# Patient Record
Sex: Female | Born: 1990 | Race: White | Hispanic: No | Marital: Married | Smoking: Current every day smoker
Health system: Southern US, Community
[De-identification: ages and names within clinical notes are randomized; demographics above are authoritative.]

## PROBLEM LIST (undated history)

## (undated) ENCOUNTER — Inpatient Hospital Stay (HOSPITAL_COMMUNITY): Payer: Self-pay

## (undated) DIAGNOSIS — F329 Major depressive disorder, single episode, unspecified: Secondary | ICD-10-CM

## (undated) DIAGNOSIS — G8929 Other chronic pain: Secondary | ICD-10-CM

## (undated) DIAGNOSIS — Z8742 Personal history of other diseases of the female genital tract: Secondary | ICD-10-CM

## (undated) DIAGNOSIS — M549 Dorsalgia, unspecified: Secondary | ICD-10-CM

## (undated) DIAGNOSIS — R519 Headache, unspecified: Secondary | ICD-10-CM

## (undated) DIAGNOSIS — R4586 Emotional lability: Secondary | ICD-10-CM

## (undated) DIAGNOSIS — F32A Depression, unspecified: Secondary | ICD-10-CM

## (undated) DIAGNOSIS — F419 Anxiety disorder, unspecified: Secondary | ICD-10-CM

## (undated) DIAGNOSIS — R51 Headache: Secondary | ICD-10-CM

## (undated) DIAGNOSIS — Q627 Congenital vesico-uretero-renal reflux: Secondary | ICD-10-CM

## (undated) HISTORY — PX: CYSTOSCOPY W/ URETERAL STENT PLACEMENT: SHX1429

## (undated) HISTORY — DX: Personal history of other diseases of the female genital tract: Z87.42

## (undated) HISTORY — DX: Anxiety disorder, unspecified: F41.9

---

## 2015-11-11 NOTE — L&D Delivery Note (Signed)
Delivery Note At 11:01 PM a viable and healthy female was delivered via Vaginal, Spontaneous Delivery (Presentation: LOA ).  APGAR: 8, 9; weight  pending.   Placenta status: spontaneous, intact.  Cord:  with the following complications: none.  Cord pH: na  Anesthesia:  epidural Episiotomy: Median due to persistent fetal bradycardia Lacerations: 2nd degree Suture Repair: 2.0 vicryl rapide Est. Blood Loss (mL): 250  Mom to postpartum.  Baby to Couplet care / Skin to Skin.  Davi Kroon J 10/23/2016, 11:24 PM

## 2016-02-12 DIAGNOSIS — Z3A01 Less than 8 weeks gestation of pregnancy: Secondary | ICD-10-CM | POA: Diagnosis not present

## 2016-02-12 DIAGNOSIS — O0901 Supervision of pregnancy with history of infertility, first trimester: Secondary | ICD-10-CM | POA: Diagnosis not present

## 2016-02-12 DIAGNOSIS — Z3201 Encounter for pregnancy test, result positive: Secondary | ICD-10-CM | POA: Diagnosis not present

## 2016-02-14 DIAGNOSIS — Z3A01 Less than 8 weeks gestation of pregnancy: Secondary | ICD-10-CM | POA: Diagnosis not present

## 2016-02-14 DIAGNOSIS — O0901 Supervision of pregnancy with history of infertility, first trimester: Secondary | ICD-10-CM | POA: Diagnosis not present

## 2016-02-18 DIAGNOSIS — Z3A01 Less than 8 weeks gestation of pregnancy: Secondary | ICD-10-CM | POA: Diagnosis not present

## 2016-02-18 DIAGNOSIS — O0901 Supervision of pregnancy with history of infertility, first trimester: Secondary | ICD-10-CM | POA: Diagnosis not present

## 2016-02-27 DIAGNOSIS — R300 Dysuria: Secondary | ICD-10-CM | POA: Diagnosis not present

## 2016-02-27 DIAGNOSIS — Z3201 Encounter for pregnancy test, result positive: Secondary | ICD-10-CM | POA: Diagnosis not present

## 2016-03-28 DIAGNOSIS — Z3401 Encounter for supervision of normal first pregnancy, first trimester: Secondary | ICD-10-CM | POA: Diagnosis not present

## 2016-03-28 LAB — OB RESULTS CONSOLE ABO/RH: RH Type: POSITIVE

## 2016-03-28 LAB — OB RESULTS CONSOLE RUBELLA ANTIBODY, IGM: RUBELLA: IMMUNE

## 2016-03-28 LAB — OB RESULTS CONSOLE GC/CHLAMYDIA
Chlamydia: NEGATIVE
GC PROBE AMP, GENITAL: NEGATIVE

## 2016-03-28 LAB — OB RESULTS CONSOLE ANTIBODY SCREEN: ANTIBODY SCREEN: NEGATIVE

## 2016-03-28 LAB — OB RESULTS CONSOLE HEPATITIS B SURFACE ANTIGEN: HEP B S AG: NEGATIVE

## 2016-03-28 LAB — OB RESULTS CONSOLE HIV ANTIBODY (ROUTINE TESTING): HIV: NONREACTIVE

## 2016-03-28 LAB — OB RESULTS CONSOLE RPR: RPR: NONREACTIVE

## 2016-04-03 DIAGNOSIS — Z113 Encounter for screening for infections with a predominantly sexual mode of transmission: Secondary | ICD-10-CM | POA: Diagnosis not present

## 2016-04-03 DIAGNOSIS — Z3491 Encounter for supervision of normal pregnancy, unspecified, first trimester: Secondary | ICD-10-CM | POA: Diagnosis not present

## 2016-04-03 DIAGNOSIS — O0901 Supervision of pregnancy with history of infertility, first trimester: Secondary | ICD-10-CM | POA: Diagnosis not present

## 2016-04-03 DIAGNOSIS — Z36 Encounter for antenatal screening of mother: Secondary | ICD-10-CM | POA: Diagnosis not present

## 2016-04-10 DIAGNOSIS — Z36 Encounter for antenatal screening of mother: Secondary | ICD-10-CM | POA: Diagnosis not present

## 2016-05-08 DIAGNOSIS — Z36 Encounter for antenatal screening of mother: Secondary | ICD-10-CM | POA: Diagnosis not present

## 2016-05-08 DIAGNOSIS — Z3402 Encounter for supervision of normal first pregnancy, second trimester: Secondary | ICD-10-CM | POA: Diagnosis not present

## 2016-05-19 ENCOUNTER — Ambulatory Visit: Payer: Self-pay | Admitting: Family Medicine

## 2016-05-26 DIAGNOSIS — Z3482 Encounter for supervision of other normal pregnancy, second trimester: Secondary | ICD-10-CM | POA: Diagnosis not present

## 2016-05-26 DIAGNOSIS — Z36 Encounter for antenatal screening of mother: Secondary | ICD-10-CM | POA: Diagnosis not present

## 2016-06-10 DIAGNOSIS — Z36 Encounter for antenatal screening of mother: Secondary | ICD-10-CM | POA: Diagnosis not present

## 2016-07-25 DIAGNOSIS — Z36 Encounter for antenatal screening of mother: Secondary | ICD-10-CM | POA: Diagnosis not present

## 2016-07-25 DIAGNOSIS — Z23 Encounter for immunization: Secondary | ICD-10-CM | POA: Diagnosis not present

## 2016-07-25 DIAGNOSIS — Z3482 Encounter for supervision of other normal pregnancy, second trimester: Secondary | ICD-10-CM | POA: Diagnosis not present

## 2016-08-11 DIAGNOSIS — Z23 Encounter for immunization: Secondary | ICD-10-CM | POA: Diagnosis not present

## 2016-09-25 DIAGNOSIS — Z3483 Encounter for supervision of other normal pregnancy, third trimester: Secondary | ICD-10-CM | POA: Diagnosis not present

## 2016-09-25 DIAGNOSIS — Z3685 Encounter for antenatal screening for Streptococcus B: Secondary | ICD-10-CM | POA: Diagnosis not present

## 2016-09-25 LAB — OB RESULTS CONSOLE GBS: GBS: POSITIVE

## 2016-10-06 ENCOUNTER — Inpatient Hospital Stay (HOSPITAL_COMMUNITY)
Admission: AD | Admit: 2016-10-06 | Discharge: 2016-10-06 | Disposition: A | Discharge status: Home / Self Care | Payer: BLUE CROSS/BLUE SHIELD | Source: Ambulatory Visit | Attending: Emergency Medicine | Admitting: Emergency Medicine

## 2016-10-06 ENCOUNTER — Encounter (HOSPITAL_COMMUNITY): Payer: Self-pay | Admitting: *Deleted

## 2016-10-06 ENCOUNTER — Inpatient Hospital Stay (HOSPITAL_COMMUNITY): Payer: BLUE CROSS/BLUE SHIELD

## 2016-10-06 DIAGNOSIS — R4781 Slurred speech: Secondary | ICD-10-CM | POA: Diagnosis not present

## 2016-10-06 DIAGNOSIS — O9989 Other specified diseases and conditions complicating pregnancy, childbirth and the puerperium: Secondary | ICD-10-CM | POA: Insufficient documentation

## 2016-10-06 DIAGNOSIS — R479 Unspecified speech disturbances: Secondary | ICD-10-CM | POA: Diagnosis not present

## 2016-10-06 DIAGNOSIS — O212 Late vomiting of pregnancy: Secondary | ICD-10-CM | POA: Diagnosis not present

## 2016-10-06 DIAGNOSIS — Z3A38 38 weeks gestation of pregnancy: Secondary | ICD-10-CM | POA: Insufficient documentation

## 2016-10-06 DIAGNOSIS — Z79899 Other long term (current) drug therapy: Secondary | ICD-10-CM | POA: Diagnosis not present

## 2016-10-06 DIAGNOSIS — E876 Hypokalemia: Secondary | ICD-10-CM | POA: Insufficient documentation

## 2016-10-06 DIAGNOSIS — Z7982 Long term (current) use of aspirin: Secondary | ICD-10-CM | POA: Insufficient documentation

## 2016-10-06 DIAGNOSIS — F17200 Nicotine dependence, unspecified, uncomplicated: Secondary | ICD-10-CM | POA: Diagnosis not present

## 2016-10-06 DIAGNOSIS — O99283 Endocrine, nutritional and metabolic diseases complicating pregnancy, third trimester: Secondary | ICD-10-CM | POA: Diagnosis not present

## 2016-10-06 DIAGNOSIS — R220 Localized swelling, mass and lump, head: Secondary | ICD-10-CM | POA: Diagnosis not present

## 2016-10-06 DIAGNOSIS — R4701 Aphasia: Secondary | ICD-10-CM | POA: Diagnosis not present

## 2016-10-06 HISTORY — DX: Emotional lability: R45.86

## 2016-10-06 HISTORY — DX: Dorsalgia, unspecified: M54.9

## 2016-10-06 HISTORY — DX: Depression, unspecified: F32.A

## 2016-10-06 HISTORY — DX: Headache: R51

## 2016-10-06 HISTORY — DX: Major depressive disorder, single episode, unspecified: F32.9

## 2016-10-06 HISTORY — DX: Headache, unspecified: R51.9

## 2016-10-06 HISTORY — DX: Congenital vesico-uretero-renal reflux: Q62.7

## 2016-10-06 HISTORY — DX: Other chronic pain: G89.29

## 2016-10-06 LAB — CBC WITH DIFFERENTIAL/PLATELET
BASOS ABS: 0 10*3/uL (ref 0.0–0.1)
Basophils Relative: 0 %
EOS PCT: 0 %
Eosinophils Absolute: 0.1 10*3/uL (ref 0.0–0.7)
HCT: 31.7 % — ABNORMAL LOW (ref 36.0–46.0)
Hemoglobin: 11.2 g/dL — ABNORMAL LOW (ref 12.0–15.0)
LYMPHS PCT: 16 %
Lymphs Abs: 2.1 10*3/uL (ref 0.7–4.0)
MCH: 31.5 pg (ref 26.0–34.0)
MCHC: 35.3 g/dL (ref 30.0–36.0)
MCV: 89 fL (ref 78.0–100.0)
MONO ABS: 0.6 10*3/uL (ref 0.1–1.0)
MONOS PCT: 4 %
Neutro Abs: 10.2 10*3/uL — ABNORMAL HIGH (ref 1.7–7.7)
Neutrophils Relative %: 80 %
PLATELETS: 295 10*3/uL (ref 150–400)
RBC: 3.56 MIL/uL — ABNORMAL LOW (ref 3.87–5.11)
RDW: 12.8 % (ref 11.5–15.5)
WBC: 12.9 10*3/uL — ABNORMAL HIGH (ref 4.0–10.5)

## 2016-10-06 LAB — COMPREHENSIVE METABOLIC PANEL
ALBUMIN: 2.8 g/dL — AB (ref 3.5–5.0)
ALK PHOS: 98 U/L (ref 38–126)
ALT: 11 U/L — ABNORMAL LOW (ref 14–54)
AST: 18 U/L (ref 15–41)
Anion gap: 9 (ref 5–15)
BILIRUBIN TOTAL: 0.5 mg/dL (ref 0.3–1.2)
BUN: 5 mg/dL — AB (ref 6–20)
CALCIUM: 8.6 mg/dL — AB (ref 8.9–10.3)
CO2: 22 mmol/L (ref 22–32)
Chloride: 103 mmol/L (ref 101–111)
Creatinine, Ser: 0.6 mg/dL (ref 0.44–1.00)
GFR calc Af Amer: >60 mL/min (ref >60)
GFR calc non Af Amer: >60 mL/min (ref >60)
GLUCOSE: 80 mg/dL (ref 65–99)
POTASSIUM: 3.2 mmol/L — AB (ref 3.5–5.1)
Sodium: 134 mmol/L — ABNORMAL LOW (ref 135–145)
TOTAL PROTEIN: 5.5 g/dL — AB (ref 6.5–8.1)

## 2016-10-06 LAB — URINALYSIS, ROUTINE W REFLEX MICROSCOPIC
BILIRUBIN URINE: NEGATIVE
GLUCOSE, UA: NEGATIVE mg/dL
KETONES UR: NEGATIVE mg/dL
Leukocytes, UA: NEGATIVE
Nitrite: NEGATIVE
PH: 7.5 (ref 5.0–8.0)
Protein, ur: NEGATIVE mg/dL
Specific Gravity, Urine: 1.01 (ref 1.005–1.030)

## 2016-10-06 LAB — URINE MICROSCOPIC-ADD ON: RBC / HPF: NONE SEEN RBC/hpf (ref 0–5)

## 2016-10-06 MED ORDER — POTASSIUM CHLORIDE CRYS ER 20 MEQ PO TBCR
40.0000 meq | EXTENDED_RELEASE_TABLET | Freq: Once | ORAL | Status: AC
  Administered 2016-10-06: 40 meq via ORAL
  Filled 2016-10-06: qty 2

## 2016-10-06 MED ORDER — ACETAMINOPHEN 325 MG PO TABS
650.0000 mg | ORAL_TABLET | Freq: Once | ORAL | Status: AC
  Administered 2016-10-06: 650 mg via ORAL
  Filled 2016-10-06: qty 2

## 2016-10-06 MED ORDER — POTASSIUM CHLORIDE ER 20 MEQ PO TBCR: 20.0000 meq | EXTENDED_RELEASE_TABLET | Freq: Two times a day (BID) | ORAL | 0 refills | Status: DC

## 2016-10-06 NOTE — Discharge Instructions (Signed)
Your MRI and bloodwork were unremarkable. Your potassium level was slightly low so I have given you a prescription for a few days of supplements. Follow up with your OB this week. Return to the emergency room for new or worsening symptoms.

## 2016-10-06 NOTE — MAU Provider Note (Signed)
History    CSN: [id number]  Arrival date and time: 10/06/16 1435   First Provider Initiated Contact with Patient 10/06/16 1545     CC: 25 yo female, G1, [redacted] wks pregnant, presents to MAU with complaints of "stroke like symptoms" slurred speech, facial drooping, heavy tongue and dribbling water from moth angle today. She is more fatigued, emotional, and vomiting since yesterday.  Her husband noted her face looked very droopy and her friend told her she sounded very slurred on the phone. She feels well now and appears normal per her husband and her mother. She denies migraine before this episode but feels very exhausted, tired now. Denies loss of consciousness, loss of bladder/ bowel control. Denies HA now.   Pt's mother reports of similar symptoms at 7616-17 yrs age, when she had severe migraine, was in doctor's office, was brought into a dark room, when she started to have arm and leg numbness, followed by torso numbness, then facial drooping and numbness. She was brought to the hospital and CT was negative. Her symptoms resolved within one hour entirely. The difference today per pt and her mother is that she didn't have migraine today.  She had an other CT of head in 18-19 at [facility] for dizziness and was clear. Heart was clear. She does not recall getting EEG. She does not have diagnosis of epilepsy.   No h/o VTE/thrombophilia in family. Pt has tolerated OCs in past, but stopped in few weeks due to abnormal bleeding. She does not smoke.   Reports good fetal movements, denies contractions, bleeding, leakage of fluid per vagina. Good PNCare, primary is Dr Billy Coastaavon and also gets migraines meds from him, does not have a Insurance account managereurologist.   HPI  Past Medical History:  Diagnosis Date  . Chronic back pain    States she thinks it is scoliosis, not diagnosed  . Depression   . Headache   . Mood swings (HCC)   . Unilateral congenital vesico-uretero-renal reflux    L kidney, has received tx, "putty" to  prevent reflux, [facility]    Past Surgical History:  Procedure Laterality Date  . CYSTOSCOPY W/ URETERAL STENT PLACEMENT      History reviewed. No pertinent family history.  Social History  Substance Use Topics  . Smoking status: Current Every Day Smoker    Packs/day: 1.00  . Smokeless tobacco: Never Used  . Alcohol use Not on file    Allergies:  Allergies  Allergen Reactions  . Sulfur Hives and Itching    Prescriptions Prior to Admission  Medication Sig Dispense Refill Last Dose  . aspirin-acetaminophen-caffeine (EXCEDRIN MIGRAINE) 250-250-65 MG tablet Take 1 tablet by mouth every 6 (six) hours as needed for headache.   10/06/2016 at Unknown time  . butalbital-acetaminophen-caffeine (FIORICET, ESGIC) 50-325-40 MG tablet Take 1-2 tablets by mouth every 4 (four) hours as needed. Max 6 tabs per day  0 Past Week at Unknown time  . FLUoxetine (PROZAC) 10 MG capsule Take 10 mg by mouth daily. Take with 20mg  cap for total dose of 30mg  QD  7 10/06/2016 at Unknown time  . FLUoxetine (PROZAC) 20 MG capsule Take 10 mg by mouth daily. Take with 10mg  cap for total dose of 30mg  QD  11 10/06/2016 at Unknown time  . Prenatal Vit-Fe Fumarate-FA (PRENATAL MULTIVITAMIN) TABS tablet Take 1 tablet by mouth daily at 12 noon.   10/06/2016 at Unknown time    ROS Physical Exam   Blood pressure 131/80, pulse  89, temperature 98.2 F (36.8 C), resp. rate 18, height 5\' 6"  (1.676 m), weight 169 lb (76.7 kg), last menstrual period 01/12/2016.  Physical Exam A&O x 3, no acute distress. Pleasant.Speech normal per family.  HEENT neg. Cranial nerve exam grossly normal  Abdo soft, non tender, non acute gravid uterus  Extr no edema/ tenderness. Normal motor/sensory exam  FHT 125-130/ + accels/ no decels/ mod variability- NST reactive/  No contractions  MAU Course  Procedures None   Assessment and Plan  25 yo female at 7238 wks gestation with acute transient neurologic changes with prior  such history at 4816 but this time not associated with migraine per patient.  Spoke with [facility] ER physician. Recommending [facility] for complete Neuro evaluation as patient and family desire to proceed soon.  Patient informed, will work on transport. [facility] Change Nurse to communicate with ED charge nurse.  Pt keeps her f/up with Dr Billy Coastaavon as scheduled on 10/10/16  Sumaiyah Markert R 10/06/2016, 4:41 PM

## 2016-10-06 NOTE — ED Triage Notes (Signed)
Patient comes in with c/o nausea, vomiting and tongue swelling since yesterday. Patient with first pregancy and 38 weeks. Patient's been nauseous throughout pregnancy but it has worsened. Her nausea meds prescribed by ob/gyn made her more nauseous. Patient went to women's and they ruled out maternity issues. Patient is a/ox4. No unilateral weakness. No dizziness. No new meds to cause swelling of tongue. Tonsils and uvula look normal.

## 2016-10-06 NOTE — MAU Provider Note (Signed)
History     CSN: [id number]  Arrival date and time: 10/06/16 1435   First Provider Initiated Contact with Patient 10/06/16 1545      Chief Complaint  Patient presents with  . Emesis   HPI  Ms.Becky Stafford is a 25 y.o. female G1P0 @ 6771w2d here in MAU with "stroke like symptoms" concerns about her mouth feeling swollen and vomiting. In the last 24 hours she has vomited 3-4 times.   She has a history of migraine, her last migraine was this morning and it went away after taking excedrin. She had symptoms like this around age 25. She was never followed up by neurology.   Yesterday she felt like she was spitting her food and spitting her salvia when she tried to speak. The symptoms are coming and going. Currently she feels like the symptoms are resolving. Patient's husband felt like she was slurring her words, and sounded "slow." on the phone.    Denies new medication use, no one else in the house is sick.   OB History    Gravida Para Term Preterm AB Living   1             SAB TAB Ectopic Multiple Live Births                  Past Medical History:  Diagnosis Date  . Chronic back pain    States she thinks it is scoliosis, not diagnosed  . Depression   . Headache   . Mood swings (HCC)   . Unilateral congenital vesico-uretero-renal reflux    L kidney, has received tx, "putty" to prevent reflux, [facility]    Past Surgical History:  Procedure Laterality Date  . CYSTOSCOPY W/ URETERAL STENT PLACEMENT      History reviewed. No pertinent family history.  Social History  Substance Use Topics  . Smoking status: Current Every Day Smoker    Packs/day: 1.00  . Smokeless tobacco: Never Used  . Alcohol use Not on file    Allergies: Allergies not on file  No prescriptions prior to admission.   Results for orders placed or performed during the hospital encounter of 10/06/16 (from the past 48 hour(s))  Urinalysis, Routine w reflex microscopic (not at [facility])      Status: Abnormal   Collection Time: 10/06/16  3:30 PM  Result Value Ref Range   Color, Urine YELLOW YELLOW   APPearance CLEAR CLEAR   Specific Gravity, Urine 1.010 1.005 - 1.030   pH 7.5 5.0 - 8.0   Glucose, UA NEGATIVE NEGATIVE mg/dL   Hgb urine dipstick TRACE (A) NEGATIVE   Bilirubin Urine NEGATIVE NEGATIVE   Ketones, ur NEGATIVE NEGATIVE mg/dL   Protein, ur NEGATIVE NEGATIVE mg/dL   Nitrite NEGATIVE NEGATIVE   Leukocytes, UA NEGATIVE NEGATIVE  Urine microscopic-add on     Status: Abnormal   Collection Time: 10/06/16  3:30 PM  Result Value Ref Range   Squamous Epithelial / LPF 6-30 (A) NONE SEEN   WBC, UA 0-5 0 - 5 WBC/hpf   RBC / HPF NONE SEEN 0 - 5 RBC/hpf   Bacteria, UA FEW (A) NONE SEEN    Review of Systems  Constitutional: Negative for chills and fever.  Gastrointestinal: Positive for nausea and vomiting. Negative for diarrhea.   Physical Exam   Blood pressure 131/80, pulse 89, temperature 98.2 F (36.8 C), resp. rate 18, height 5\' 6"  (1.676 m), weight 169 lb (76.7 kg), last menstrual period 01/12/2016.  Physical Exam  Constitutional: She is oriented to person, place, and time. She appears well-developed and well-nourished. No distress.  HENT:  Head: Normocephalic.  Eyes: Pupils are equal, round, and reactive to light.  Neck: Neck supple.  Musculoskeletal: Normal range of motion.  Neurological: She is alert and oriented to person, place, and time. She has normal strength and normal reflexes. She displays no tremor and normal reflexes. No cranial nerve deficit or sensory deficit. She exhibits normal muscle tone. She displays no seizure activity. Coordination and gait normal. GCS eye subscore is 4. GCS verbal subscore is 5. GCS motor subscore is 6.  Skin: Skin is warm. She is not diaphoretic.  Psychiatric: Her behavior is normal.    MAU Course  Procedures  None   MDM  Discussed patient with Dr. Juliene PinaMody @ 1400; Dr. Juliene PinaMody to come to MAU to evaluate patient with plan  for possible transfer.   Assessment and Plan    Transfer to [facility] per Dr. Juliene PinaMody. Category one fetal tracing    Duane LopeJennifer I Rasch, NP 10/06/2016 4:52 PM

## 2016-10-06 NOTE — MAU Note (Signed)
Pt presents to MAU with complaints of "stroke like symptoms" slurred speech, fatigue, emotional, and vomiting since yesterday. Denies any vaginal bleeding abnormal discharge

## 2016-10-06 NOTE — ED Provider Notes (Signed)
[facility]-EMERGENCY DEPT Provider Note   CSN: [id number] Arrival date & time: 10/06/16  1435   History   Chief Complaint Chief Complaint  Patient presents with  . Emesis   HPI   Becky Stafford is an 25 y.o. female G1 currently 5038 weeks pregnant who presents to the emergency room from MAU for evaluation of intermittent transient slurred speech. Over the past 24 hrs pt has had intermittent episodes of slurred speech, facial drooping. Her husband accompanies her and states that when he arrived home this afternoon pt's speech sounded slurred like she couldn't pronounce her words. She denies arm or leg numbness or weakness. She states she had a headache this morning but took excedrin and her headache resolved. She states that a similar episode of slurred speech occurred once before when she was a teenager and occurred with a migraine. She states today's symptoms feel identical but denies migraine today. Per EMS pt's symptoms resolved while they were en route and pt's speech returned to baseline, though are now returning in the ED. She denies headache. Denies blurred vision. Denies chest pain, SOB, abdominal pain, n/v/d, vaginal bleeding or discharge. Fetal movements are good. Fetal tracing at MAU unremarkable. Pt denies history of clots or malignancy, denies family history of same. She takes Fioricet as needed for headaches at home, last taken one week ago. She is on prozac, no new changes to meds. Endorses smoking 1 PPD, denies other drug or alcohol use.  Past Medical History:  Diagnosis Date  . Chronic back pain    States she thinks it is scoliosis, not diagnosed  . Depression   . Headache   . Mood swings (HCC)   . Unilateral congenital vesico-uretero-renal reflux    L kidney, has received tx, "putty" to prevent reflux, [facility]    There are no active problems to display for this patient.   Past Surgical History:  Procedure Laterality Date  . CYSTOSCOPY W/ URETERAL STENT  PLACEMENT      OB History    Gravida Para Term Preterm AB Living   1             SAB TAB Ectopic Multiple Live Births                   Home Medications    Prior to Admission medications   Medication Sig Start Date End Date Taking? Authorizing Provider  aspirin-acetaminophen-caffeine (EXCEDRIN MIGRAINE) [id number] MG tablet Take 1 tablet by mouth every 6 (six) hours as needed for headache.   Yes Historical Provider, MD  butalbital-acetaminophen-caffeine (FIORICET, ESGIC) 50-325-40 MG tablet Take 1-2 tablets by mouth every 4 (four) hours as needed. Max 6 tabs per day 07/30/16  Yes Historical Provider, MD  FLUoxetine (PROZAC) 10 MG capsule Take 10 mg by mouth daily. Take with 20mg  cap for total dose of 30mg  QD 09/09/16  Yes Historical Provider, MD  FLUoxetine (PROZAC) 20 MG capsule Take 10 mg by mouth daily. Take with 10mg  cap for total dose of 30mg  QD 09/09/16  Yes Historical Provider, MD  Prenatal Vit-Fe Fumarate-FA (PRENATAL MULTIVITAMIN) TABS tablet Take 1 tablet by mouth daily at 12 noon.   Yes Historical Provider, MD    Family History History reviewed. No pertinent family history.  Social History Social History  Substance Use Topics  . Smoking status: Current Every Day Smoker    Packs/day: 1.00  . Smokeless tobacco: Never Used  . Alcohol use Not on file     Allergies  Sulfur   Review of Systems Review of Systems 10 Systems reviewed and are negative for acute change except as noted in the HPI.   Physical Exam Updated Vital Signs BP 136/74 (BP Location: Right Arm)   Pulse 78   Temp 97.5 F (36.4 C) (Oral)   Resp 16   Ht 5\' 6"  (1.676 m)   Wt 76.7 kg   LMP 01/12/2016   BMI 27.28 kg/m   Physical Exam  Constitutional: She is oriented to person, place, and time.  HENT:  Right Ear: External ear normal.  Left Ear: External ear normal.  Nose: Nose normal.  Mouth/Throat: Oropharynx is clear and moist. No oropharyngeal exudate.  Eyes: Conjunctivae and EOM are  normal. Pupils are equal, round, and reactive to light.  Neck: Normal range of motion. Neck supple.  Cardiovascular: Normal rate, regular rhythm, normal heart sounds and intact distal pulses.   Pulmonary/Chest: Effort normal and breath sounds normal. No respiratory distress. She has no wheezes.  Abdominal: Soft. Bowel sounds are normal. She exhibits no distension. There is no tenderness. There is no rebound and no guarding.  gravid  Musculoskeletal: She exhibits no edema.  Lymphadenopathy:    She has no cervical adenopathy.  Neurological: She is alert and oriented to person, place, and time.  Speech slightly slurred with poor enunciation of some words. Slight droop of left corner of mouth. No tongue deviation. Cranial nerves otherwise intact 5/5 strength throughout Sensation intact throughout Normal finger to nose No pronator drift Steady gait 2+ DTR throughout  Skin: Skin is warm and dry.  Psychiatric: She has a normal mood and affect.  Nursing note and vitals reviewed.    ED Treatments / Results  Labs (all labs ordered are listed, but only abnormal results are displayed) Labs Reviewed  URINALYSIS, ROUTINE W REFLEX MICROSCOPIC (NOT AT [facility]) - Abnormal; Notable for the following:       Result Value   Hgb urine dipstick TRACE (*)    All other components within normal limits  URINE MICROSCOPIC-ADD ON - Abnormal; Notable for the following:    Squamous Epithelial / LPF 6-30 (*)    Bacteria, UA FEW (*)    All other components within normal limits  COMPREHENSIVE METABOLIC PANEL - Abnormal; Notable for the following:    Sodium 134 (*)    Potassium 3.2 (*)    BUN 5 (*)    Calcium 8.6 (*)    Total Protein 5.5 (*)    Albumin 2.8 (*)    ALT 11 (*)    All other components within normal limits  CBC WITH DIFFERENTIAL/PLATELET - Abnormal; Notable for the following:    WBC 12.9 (*)    RBC 3.56 (*)    Hemoglobin 11.2 (*)    HCT 31.7 (*)    Neutro Abs 10.2 (*)    All other components  within normal limits    EKG  EKG Interpretation None       Radiology Mr Brain Wo Contrast  Result Date: 10/06/2016 CLINICAL DATA:  Recurrent episodes of mouth swelling and vomiting for 1 day. Slowed, slurred speech. Similar symptoms at age 25. EXAM: MRI HEAD WITHOUT CONTRAST TECHNIQUE: Multiplanar, multiecho pulse sequences of the brain and surrounding structures were obtained without intravenous contrast. COMPARISON:  None. FINDINGS: BRAIN: No reduced diffusion to suggest acute ischemia nor hyperacute demyelination. No susceptibility artifact to suggest hemorrhage. The ventricles and sulci are normal for patient's age. No suspicious parenchymal signal, masses or mass effect. No abnormal extra-axial  fluid collections. No extra-axial masses though, contrast enhanced sequences would be more sensitive. VASCULAR: Normal major intracranial vascular flow voids present at skull base. Major dural venous sinus flow voids maintained. SKULL AND UPPER CERVICAL SPINE: No abnormal sellar expansion. No suspicious calvarial bone marrow signal. Craniocervical junction maintained. SINUSES/ORBITS: Soft tissue opacifies the RIGHT sphenoid sinus. Trace mastoid effusions. The included ocular globes and orbital contents are non-suspicious. OTHER: None. IMPRESSION: Normal noncontrast MRI head. RIGHT sphenoid sinusitis. Electronically Signed   By: Awilda Metro M.D.   On: 10/06/2016 20:16    Procedures Procedures (including critical care time)  Medications Ordered in ED Medications - No data to display   Initial Impression / Assessment and Plan / ED Course  I have reviewed the triage vital signs and the nursing notes.  Pertinent labs & imaging results that were available during my care of the patient were reviewed by me and considered in my medical decision making (see chart for details).  Clinical Course    MRI negative. Her symptoms are improving and speech much clearer. Labs with mild hypokalemia and  otherwise unrevealing. Discussed with attending dr. Jeraldine Loots. We will replete pt's potassium. Encouraged close f/u with OB. Pt states she will f/u with neuro when things settle down. ER return precautions given.  Final Clinical Impressions(s) / ED Diagnoses   Final diagnoses:  Speech disturbance, unspecified type  Hypokalemia    New Prescriptions Discharge Medication List as of 10/06/2016  9:40 PM    START taking these medications   Details  potassium chloride 20 MEQ TBCR Take 20 mEq by mouth 2 (two) times daily., Starting Mon 10/06/2016, Print         Carlene Coria, PA-C 10/07/16 [id number]    Gerhard Munch, MD 10/08/16 [phone]

## 2016-10-06 NOTE — MAU Note (Signed)
[facility] in [facility] condition.

## 2016-10-06 NOTE — ED Notes (Signed)
Patient transported to MRI 

## 2016-10-07 DIAGNOSIS — O26893 Other specified pregnancy related conditions, third trimester: Secondary | ICD-10-CM | POA: Diagnosis not present

## 2016-10-07 DIAGNOSIS — R4781 Slurred speech: Secondary | ICD-10-CM | POA: Diagnosis not present

## 2016-10-07 DIAGNOSIS — R29810 Facial weakness: Secondary | ICD-10-CM | POA: Diagnosis not present

## 2016-10-08 DIAGNOSIS — Z3A38 38 weeks gestation of pregnancy: Secondary | ICD-10-CM | POA: Diagnosis not present

## 2016-10-08 DIAGNOSIS — O368130 Decreased fetal movements, third trimester, not applicable or unspecified: Secondary | ICD-10-CM | POA: Diagnosis not present

## 2016-10-10 DIAGNOSIS — Z3A38 38 weeks gestation of pregnancy: Secondary | ICD-10-CM | POA: Diagnosis not present

## 2016-10-10 DIAGNOSIS — O368130 Decreased fetal movements, third trimester, not applicable or unspecified: Secondary | ICD-10-CM | POA: Diagnosis not present

## 2016-10-16 ENCOUNTER — Other Ambulatory Visit: Payer: Self-pay | Admitting: Obstetrics and Gynecology

## 2016-10-20 ENCOUNTER — Encounter (HOSPITAL_COMMUNITY): Payer: Self-pay | Admitting: *Deleted

## 2016-10-20 ENCOUNTER — Telehealth (HOSPITAL_COMMUNITY): Payer: Self-pay | Admitting: *Deleted

## 2016-10-20 NOTE — Telephone Encounter (Signed)
Preadmission screen  

## 2016-10-23 ENCOUNTER — Encounter (HOSPITAL_COMMUNITY): Payer: Self-pay

## 2016-10-23 ENCOUNTER — Inpatient Hospital Stay (HOSPITAL_COMMUNITY)
Admission: RE | Admit: 2016-10-23 | Discharge: 2016-10-25 | DRG: 775 | Disposition: A | Discharge status: Home / Self Care | Payer: BLUE CROSS/BLUE SHIELD | Source: Ambulatory Visit | Attending: Obstetrics and Gynecology | Admitting: Obstetrics and Gynecology

## 2016-10-23 ENCOUNTER — Inpatient Hospital Stay (HOSPITAL_COMMUNITY): Payer: BLUE CROSS/BLUE SHIELD | Admitting: Anesthesiology

## 2016-10-23 DIAGNOSIS — R94120 Abnormal auditory function study: Secondary | ICD-10-CM | POA: Diagnosis not present

## 2016-10-23 DIAGNOSIS — Z833 Family history of diabetes mellitus: Secondary | ICD-10-CM | POA: Diagnosis not present

## 2016-10-23 DIAGNOSIS — Z8759 Personal history of other complications of pregnancy, childbirth and the puerperium: Secondary | ICD-10-CM

## 2016-10-23 DIAGNOSIS — Z3A40 40 weeks gestation of pregnancy: Secondary | ICD-10-CM | POA: Diagnosis not present

## 2016-10-23 DIAGNOSIS — O480 Post-term pregnancy: Principal | ICD-10-CM | POA: Diagnosis present

## 2016-10-23 DIAGNOSIS — Z23 Encounter for immunization: Secondary | ICD-10-CM | POA: Diagnosis not present

## 2016-10-23 LAB — CBC
HCT: 32.6 % — ABNORMAL LOW (ref 36.0–46.0)
Hemoglobin: 11.9 g/dL — ABNORMAL LOW (ref 12.0–15.0)
MCH: 32.1 pg (ref 26.0–34.0)
MCHC: 36.5 g/dL — ABNORMAL HIGH (ref 30.0–36.0)
MCV: 87.9 fL (ref 78.0–100.0)
PLATELETS: 300 10*3/uL (ref 150–400)
RBC: 3.71 MIL/uL — AB (ref 3.87–5.11)
RDW: 12.9 % (ref 11.5–15.5)
WBC: 11.1 10*3/uL — AB (ref 4.0–10.5)

## 2016-10-23 LAB — RPR: RPR Ser Ql: NONREACTIVE

## 2016-10-23 LAB — TYPE AND SCREEN
ABO/RH(D): O POS
ANTIBODY SCREEN: NEGATIVE

## 2016-10-23 LAB — ABO/RH: ABO/RH(D): O POS

## 2016-10-23 MED ORDER — TERBUTALINE SULFATE 1 MG/ML IJ SOLN
0.2500 mg | Freq: Once | INTRAMUSCULAR | Status: DC | PRN
  Filled 2016-10-23: qty 1

## 2016-10-23 MED ORDER — LIDOCAINE HCL (PF) 1 % IJ SOLN
30.0000 mL | INTRAMUSCULAR | Status: DC | PRN
  Filled 2016-10-23: qty 30

## 2016-10-23 MED ORDER — OXYTOCIN 40 UNITS IN LACTATED RINGERS INFUSION - SIMPLE MED
2.5000 [IU]/h | INTRAVENOUS | Status: DC
  Administered 2016-10-24: 2.5 [IU]/h via INTRAVENOUS
  Filled 2016-10-23: qty 1000

## 2016-10-23 MED ORDER — LIDOCAINE HCL (PF) 1 % IJ SOLN
INTRAMUSCULAR | Status: DC | PRN
  Administered 2016-10-23 (×2): 7 mL via EPIDURAL

## 2016-10-23 MED ORDER — PENICILLIN G POT IN DEXTROSE 60000 UNIT/ML IV SOLN: 3.0000 10*6.[IU] | INTRAVENOUS | Status: DC

## 2016-10-23 MED ORDER — PENICILLIN G POT IN DEXTROSE 60000 UNIT/ML IV SOLN
3.0000 10*6.[IU] | INTRAVENOUS | Status: DC
  Administered 2016-10-23 (×3): 3 10*6.[IU] via INTRAVENOUS
  Filled 2016-10-23 (×7): qty 50

## 2016-10-23 MED ORDER — FENTANYL 2.5 MCG/ML BUPIVACAINE 1/10 % EPIDURAL INFUSION (WH - ANES)
14.0000 mL/h | INTRAMUSCULAR | Status: DC | PRN
  Administered 2016-10-23 (×3): 14 mL/h via EPIDURAL
  Filled 2016-10-23: qty 100

## 2016-10-23 MED ORDER — PENICILLIN G POTASSIUM 5000000 UNITS IJ SOLR
5.0000 10*6.[IU] | Freq: Once | INTRAVENOUS | Status: AC
  Administered 2016-10-23: 5 10*6.[IU] via INTRAVENOUS
  Filled 2016-10-23: qty 5

## 2016-10-23 MED ORDER — LACTATED RINGERS IV SOLN: 500.0000 mL | INTRAVENOUS | Status: DC | PRN

## 2016-10-23 MED ORDER — FLEET ENEMA 7-19 GM/118ML RE ENEM: 1.0000 | ENEMA | RECTAL | Status: DC | PRN

## 2016-10-23 MED ORDER — OXYTOCIN 40 UNITS IN LACTATED RINGERS INFUSION - SIMPLE MED
1.0000 m[IU]/min | INTRAVENOUS | Status: DC
  Administered 2016-10-23: 2 m[IU]/min via INTRAVENOUS
  Filled 2016-10-23: qty 1000

## 2016-10-23 MED ORDER — OXYTOCIN BOLUS FROM INFUSION
500.0000 mL | Freq: Once | INTRAVENOUS | Status: AC
  Administered 2016-10-23: 500 mL via INTRAVENOUS

## 2016-10-23 MED ORDER — LACTATED RINGERS IV SOLN
INTRAVENOUS | Status: DC
  Administered 2016-10-23 (×2): via INTRAVENOUS

## 2016-10-23 MED ORDER — DIPHENHYDRAMINE HCL 50 MG/ML IJ SOLN: 12.5000 mg | INTRAMUSCULAR | Status: DC | PRN

## 2016-10-23 MED ORDER — FENTANYL 2.5 MCG/ML BUPIVACAINE 1/10 % EPIDURAL INFUSION (WH - ANES)
INTRAMUSCULAR | Status: AC
  Filled 2016-10-23: qty 100

## 2016-10-23 MED ORDER — PHENYLEPHRINE 40 MCG/ML (10ML) SYRINGE FOR IV PUSH (FOR BLOOD PRESSURE SUPPORT)
80.0000 ug | PREFILLED_SYRINGE | INTRAVENOUS | Status: DC | PRN
  Filled 2016-10-23: qty 5

## 2016-10-23 MED ORDER — OXYCODONE-ACETAMINOPHEN 5-325 MG PO TABS
1.0000 | ORAL_TABLET | ORAL | Status: DC | PRN
  Administered 2016-10-24: 1 via ORAL
  Filled 2016-10-23: qty 1

## 2016-10-23 MED ORDER — METHYLERGONOVINE MALEATE 0.2 MG/ML IJ SOLN: 0.2000 mg | Freq: Once | INTRAMUSCULAR | Status: DC

## 2016-10-23 MED ORDER — ONDANSETRON HCL 4 MG/2ML IJ SOLN
4.0000 mg | Freq: Four times a day (QID) | INTRAMUSCULAR | Status: DC | PRN
Start: 2016-10-23 — End: 2016-10-24
  Administered 2016-10-23 – 2016-10-24 (×2): 4 mg via INTRAVENOUS
  Filled 2016-10-23 (×2): qty 2

## 2016-10-23 MED ORDER — PHENYLEPHRINE 40 MCG/ML (10ML) SYRINGE FOR IV PUSH (FOR BLOOD PRESSURE SUPPORT)
PREFILLED_SYRINGE | INTRAVENOUS | Status: AC
  Filled 2016-10-23: qty 20

## 2016-10-23 MED ORDER — EPHEDRINE 5 MG/ML INJ
10.0000 mg | INTRAVENOUS | Status: DC | PRN
  Filled 2016-10-23: qty 4

## 2016-10-23 MED ORDER — ACETAMINOPHEN 325 MG PO TABS: 650.0000 mg | ORAL_TABLET | ORAL | Status: DC | PRN

## 2016-10-23 MED ORDER — SOD CITRATE-CITRIC ACID 500-334 MG/5ML PO SOLN: 30.0000 mL | ORAL | Status: DC | PRN

## 2016-10-23 MED ORDER — PENICILLIN G POTASSIUM 5000000 UNITS IJ SOLR: 5.0000 10*6.[IU] | Freq: Once | INTRAVENOUS | Status: DC

## 2016-10-23 MED ORDER — LACTATED RINGERS IV SOLN
500.0000 mL | Freq: Once | INTRAVENOUS | Status: AC
  Administered 2016-10-23: 500 mL via INTRAVENOUS

## 2016-10-23 MED ORDER — METHYLERGONOVINE MALEATE 0.2 MG/ML IJ SOLN
INTRAMUSCULAR | Status: AC
  Administered 2016-10-23: 0.2 mg
  Filled 2016-10-23: qty 1

## 2016-10-23 MED ORDER — METHYLERGONOVINE MALEATE 0.2 MG/ML IJ SOLN
INTRAMUSCULAR | Status: AC
  Filled 2016-10-23: qty 1

## 2016-10-23 MED ORDER — OXYCODONE-ACETAMINOPHEN 5-325 MG PO TABS: 2.0000 | ORAL_TABLET | ORAL | Status: DC | PRN

## 2016-10-23 NOTE — Anesthesia Procedure Notes (Signed)
Epidural Patient location during procedure: OB Start time: 10/23/2016 1:08 PM End time: 10/23/2016 1:11 PM  Staffing Anesthesiologist: Leilani AbleHATCHETT, Avereigh Spainhower Performed: anesthesiologist   Preanesthetic Checklist Completed: patient identified, surgical consent, pre-op evaluation, timeout performed, IV checked, risks and benefits discussed and monitors and equipment checked  Epidural Patient position: sitting Prep: site prepped and draped and DuraPrep Patient monitoring: continuous pulse ox and blood pressure Approach: midline Location: L3-L4 Injection technique: LOR air  Needle:  Needle type: Tuohy  Needle gauge: 17 G Needle length: 9 cm and 9 Needle insertion depth: 6 cm Catheter type: closed end flexible Catheter size: 19 Gauge Catheter at skin depth: 10 cm Test dose: negative and Other  Assessment Sensory level: T9 Events: blood not aspirated, injection not painful, no injection resistance, negative IV test and no paresthesia  Additional Notes Reason for block:procedure for pain

## 2016-10-23 NOTE — Progress Notes (Signed)
Becky Stafford is a 25 y.o. G1P0 at 7742w5d by LMP admitted for induction of labor due to postdates.  Subjective: Comfortable  Objective: BP 125/89   Pulse 91   Temp 98.7 F (37.1 C) (Oral)   Resp 20   Ht 5\' 5"  (1.651 m)   Wt 75.8 kg (167 lb)   LMP 01/12/2016   SpO2 100%   BMI 27.79 kg/m  I/O last 3 completed shifts: In: -  Out: 250 [Urine:250] No intake/output data recorded.  FHT:  FHR: 145 bpm, variability: moderate,  accelerations:  Present,  decelerations:  Absent UC:   regular, every 2 minutes SVE:   7-8/100/0  Labs: Lab Results  Component Value Date   WBC 11.1 (H) 10/23/2016   HGB 11.9 (L) 10/23/2016   HCT 32.6 (L) 10/23/2016   MCV 87.9 10/23/2016   PLT 300 10/23/2016    Assessment / Plan: Induction of labor due to postterm,  progressing well on pitocin  Labor: Progressing normally Preeclampsia:  no signs or symptoms of toxicity Fetal Wellbeing:  Category I Pain Control:  Epidural I/D:  n/a Anticipated MOD:  NSVD  Janee Ureste J 10/23/2016, 7:10 PM

## 2016-10-23 NOTE — H&P (Signed)
Becky Stafford is a 25 y.o. female presenting for postdates.  OB History    Gravida Para Term Preterm AB Living   1             SAB TAB Ectopic Multiple Live Births                 Past Medical History:  Diagnosis Date  . Anxiety   . Chronic back pain    States she thinks it is scoliosis, not diagnosed  . Depression   . Headache   . History of PCOS   . Mood swings (HCC)   . Unilateral congenital vesico-uretero-renal reflux    L kidney, has received tx, "putty" to prevent reflux, [facility]   Past Surgical History:  Procedure Laterality Date  . CYSTOSCOPY W/ URETERAL STENT PLACEMENT     Family History: family history includes Cancer in her maternal grandmother; Diabetes in her paternal aunt. Social History:  reports that she has been smoking.  She has been smoking about 1.00 pack per day. She has never used smokeless tobacco. She reports that she uses drugs, including Marijuana. Her alcohol history is not on file.     Maternal Diabetes: No Genetic Screening: Normal Maternal Ultrasounds/Referrals: Normal Fetal Ultrasounds or other Referrals:  None Maternal Substance Abuse:  No Significant Maternal Medications:  None Significant Maternal Lab Results:  None Other Comments:  None  Review of Systems  Constitutional: Negative.   All other systems reviewed and are negative.  Maternal Medical History:  Contractions: Onset was less than 1 hour ago.   Perceived severity is mild.    Fetal activity: Perceived fetal activity is normal.   Last perceived fetal movement was within the past hour.    Prenatal complications: no prenatal complications Prenatal Complications - Diabetes: none.      Blood pressure (!) 142/86, pulse 97, temperature 97.7 F (36.5 C), temperature source Oral, resp. rate 18, height 5\' 5"  (1.651 m), weight 75.8 kg (167 lb), last menstrual period 01/12/2016. Maternal Exam:  Uterine Assessment: Contraction strength is mild.  Contraction  frequency is rare.   Abdomen: Fetal presentation: vertex  Introitus: Normal vulva. Normal vagina.  Ferning test: negative.  Nitrazine test: negative. Amniotic fluid character: not assessed.  Pelvis: adequate for delivery.      Physical Exam  Nursing note and vitals reviewed. Constitutional: She is oriented to person, place, and time. She appears well-developed and well-nourished.  HENT:  Head: Normocephalic and atraumatic.  Neck: Normal range of motion. Neck supple.  Cardiovascular: Normal rate and regular rhythm.   Respiratory: Effort normal and breath sounds normal.  GI: Soft. Bowel sounds are normal.  Genitourinary: Vagina normal and uterus normal.  Musculoskeletal: Normal range of motion.  Neurological: She is alert and oriented to person, place, and time. She has normal reflexes.  Skin: Skin is warm and dry.  Psychiatric: She has a normal mood and affect.    Prenatal labs: ABO, Rh: O/Positive/-- (05/19 0000) Antibody: Negative (05/19 0000) Rubella: Immune (05/19 0000) RPR: Nonreactive (05/19 0000)  HBsAg: Negative (05/19 0000)  HIV: Non-reactive (05/19 0000)  GBS: Positive (11/16 0000)   Assessment/Plan: Postdates IOL Pitocin   Becky Stafford J 10/23/2016, 8:00 AM

## 2016-10-23 NOTE — Anesthesia Preprocedure Evaluation (Signed)
Anesthesia Evaluation  Patient identified by MRN, date of birth, ID band Patient awake    Reviewed: Allergy & Precautions, H&P , NPO status , Patient's Chart, lab work & pertinent test results  Airway Mallampati: I  TM Distance: >3 FB Neck ROM: full    Dental no notable dental hx.    Pulmonary neg pulmonary ROS, Current Smoker,    Pulmonary exam normal        Cardiovascular negative cardio ROS Normal cardiovascular exam     Neuro/Psych    GI/Hepatic negative GI ROS, Neg liver ROS,   Endo/Other  negative endocrine ROS  Renal/GU negative Renal ROS     Musculoskeletal   Abdominal Normal abdominal exam  (+)   Peds  Hematology negative hematology ROS (+)   Anesthesia Other Findings   Reproductive/Obstetrics (+) Pregnancy                             Anesthesia Physical Anesthesia Plan  ASA: II  Anesthesia Plan: Epidural   Post-op Pain Management:    Induction:   Airway Management Planned:   Additional Equipment:   Intra-op Plan:   Post-operative Plan:   Informed Consent: I have reviewed the patients History and Physical, chart, labs and discussed the procedure including the risks, benefits and alternatives for the proposed anesthesia with the patient or authorized representative who has indicated his/her understanding and acceptance.     Plan Discussed with:   Anesthesia Plan Comments:         Anesthesia Quick Evaluation

## 2016-10-23 NOTE — Anesthesia Pain Management Evaluation Note (Signed)
  CRNA Pain Management Visit Note  Patient: Becky JanusAmber Tetro, 25 y.o., female  "Hello I am a member of the anesthesia team at [facility]. We have an anesthesia team available at all times to provide care throughout the hospital, including epidural management and anesthesia for C-section. I don't know your plan for the delivery whether it a natural birth, water birth, IV sedation, nitrous supplementation, doula or epidural, but we want to meet your pain goals."   1.Was your pain managed to your expectations on prior hospitalizations?   No prior hospitalizations  2.What is your expectation for pain management during this hospitalization?     Epidural  3.How can we help you reach that goal? Epidural when I am ready pain level of 5.  Record the patient's initial score and the patient's pain goal.   Pain: 0  Pain Goal: 2 [name] wants you to be able to say your pain was always managed very well.  [name] 10/23/2016

## 2016-10-24 ENCOUNTER — Encounter (HOSPITAL_COMMUNITY): Payer: Self-pay

## 2016-10-24 LAB — CBC
HCT: 31.4 % — ABNORMAL LOW (ref 36.0–46.0)
HCT: 34.1 % — ABNORMAL LOW (ref 36.0–46.0)
HEMOGLOBIN: 11.4 g/dL — AB (ref 12.0–15.0)
Hemoglobin: 12 g/dL (ref 12.0–15.0)
MCH: 31.4 pg (ref 26.0–34.0)
MCH: 31.8 pg (ref 26.0–34.0)
MCHC: 35.2 g/dL (ref 30.0–36.0)
MCHC: 36.3 g/dL — ABNORMAL HIGH (ref 30.0–36.0)
MCV: 87.5 fL (ref 78.0–100.0)
MCV: 89.3 fL (ref 78.0–100.0)
PLATELETS: 276 10*3/uL (ref 150–400)
PLATELETS: 287 10*3/uL (ref 150–400)
RBC: 3.59 MIL/uL — AB (ref 3.87–5.11)
RBC: 3.82 MIL/uL — ABNORMAL LOW (ref 3.87–5.11)
RDW: 12.8 % (ref 11.5–15.5)
RDW: 12.9 % (ref 11.5–15.5)
WBC: 20.2 10*3/uL — ABNORMAL HIGH (ref 4.0–10.5)
WBC: 23.5 10*3/uL — AB (ref 4.0–10.5)

## 2016-10-24 MED ORDER — TETANUS-DIPHTH-ACELL PERTUSSIS 5-2.5-18.5 LF-MCG/0.5 IM SUSP: 0.5000 mL | Freq: Once | INTRAMUSCULAR | Status: DC

## 2016-10-24 MED ORDER — DIPHENHYDRAMINE HCL 25 MG PO CAPS: 25.0000 mg | ORAL_CAPSULE | Freq: Four times a day (QID) | ORAL | Status: DC | PRN

## 2016-10-24 MED ORDER — ACETAMINOPHEN 325 MG PO TABS: 650.0000 mg | ORAL_TABLET | ORAL | Status: DC | PRN

## 2016-10-24 MED ORDER — FLUOXETINE HCL 10 MG PO CAPS: 10.0000 mg | ORAL_CAPSULE | Freq: Every day | ORAL | Status: DC

## 2016-10-24 MED ORDER — COCONUT OIL OIL: 1.0000 "application " | TOPICAL_OIL | Status: DC | PRN

## 2016-10-24 MED ORDER — METHYLERGONOVINE MALEATE 0.2 MG/ML IJ SOLN: 0.2000 mg | INTRAMUSCULAR | Status: DC | PRN

## 2016-10-24 MED ORDER — DIBUCAINE 1 % RE OINT
1.0000 "application " | TOPICAL_OINTMENT | RECTAL | Status: DC | PRN
  Administered 2016-10-25: 1 via RECTAL
  Filled 2016-10-24: qty 28

## 2016-10-24 MED ORDER — OXYCODONE-ACETAMINOPHEN 5-325 MG PO TABS
1.0000 | ORAL_TABLET | ORAL | Status: DC | PRN
  Administered 2016-10-24: 1 via ORAL
  Filled 2016-10-24: qty 1

## 2016-10-24 MED ORDER — OXYCODONE-ACETAMINOPHEN 5-325 MG PO TABS: 2.0000 | ORAL_TABLET | ORAL | Status: DC | PRN

## 2016-10-24 MED ORDER — SIMETHICONE 80 MG PO CHEW: 80.0000 mg | CHEWABLE_TABLET | ORAL | Status: DC | PRN

## 2016-10-24 MED ORDER — IBUPROFEN 600 MG PO TABS
600.0000 mg | ORAL_TABLET | Freq: Four times a day (QID) | ORAL | Status: DC
  Administered 2016-10-24 – 2016-10-25 (×6): 600 mg via ORAL
  Filled 2016-10-24 (×6): qty 1

## 2016-10-24 MED ORDER — ONDANSETRON HCL 4 MG PO TABS: 4.0000 mg | ORAL_TABLET | ORAL | Status: DC | PRN

## 2016-10-24 MED ORDER — BENZOCAINE-MENTHOL 20-0.5 % EX AERO
1.0000 "application " | INHALATION_SPRAY | CUTANEOUS | Status: DC | PRN
  Filled 2016-10-24: qty 56

## 2016-10-24 MED ORDER — ZOLPIDEM TARTRATE 5 MG PO TABS: 5.0000 mg | ORAL_TABLET | Freq: Every evening | ORAL | Status: DC | PRN

## 2016-10-24 MED ORDER — WITCH HAZEL-GLYCERIN EX PADS
1.0000 "application " | MEDICATED_PAD | CUTANEOUS | Status: DC | PRN
  Administered 2016-10-25: 1 via TOPICAL

## 2016-10-24 MED ORDER — FLUOXETINE HCL 20 MG PO CAPS
30.0000 mg | ORAL_CAPSULE | Freq: Every day | ORAL | Status: DC
  Administered 2016-10-24 – 2016-10-25 (×2): 30 mg via ORAL
  Filled 2016-10-24 (×3): qty 1

## 2016-10-24 MED ORDER — PROMETHAZINE HCL 25 MG/ML IJ SOLN
25.0000 mg | Freq: Once | INTRAMUSCULAR | Status: AC | PRN
  Administered 2016-10-24: 25 mg via INTRAVENOUS
  Filled 2016-10-24: qty 1

## 2016-10-24 MED ORDER — METHYLERGONOVINE MALEATE 0.2 MG PO TABS: 0.2000 mg | ORAL_TABLET | ORAL | Status: DC | PRN

## 2016-10-24 MED ORDER — SENNOSIDES-DOCUSATE SODIUM 8.6-50 MG PO TABS
2.0000 | ORAL_TABLET | ORAL | Status: DC
  Administered 2016-10-24: 2 via ORAL
  Filled 2016-10-24: qty 2

## 2016-10-24 MED ORDER — PRENATAL MULTIVITAMIN CH
1.0000 | ORAL_TABLET | Freq: Every day | ORAL | Status: DC
  Administered 2016-10-24 – 2016-10-25 (×2): 1 via ORAL
  Filled 2016-10-24 (×2): qty 1

## 2016-10-24 MED ORDER — ONDANSETRON HCL 4 MG/2ML IJ SOLN: 4.0000 mg | INTRAMUSCULAR | Status: DC | PRN

## 2016-10-24 NOTE — Anesthesia Postprocedure Evaluation (Signed)
Anesthesia Post Note  Patient: Systems analystAmber Stafford  Procedure(s) Performed: * No procedures listed *  Patient location during evaluation: Mother Baby Anesthesia Type: Epidural Level of consciousness: awake and alert Pain management: pain level controlled Vital Signs Assessment: post-procedure vital signs reviewed and stable Respiratory status: spontaneous breathing, nonlabored ventilation and respiratory function stable Cardiovascular status: stable Postop Assessment: no headache, no backache and epidural receding Anesthetic complications: no     Last Vitals:  Vitals:   10/24/16 0131 10/24/16 0235  BP: (!) 139/106 131/82  Pulse: (!) 107 70  Resp:  18  Temp:  37.3 C    Last Pain:  Vitals:   10/24/16 0605  TempSrc:   PainSc: Asleep   Pain Goal: Patients Stated Pain Goal: 5 (10/23/16 1200)               Junious SilkGILBERT,Becky Stafford

## 2016-10-24 NOTE — Progress Notes (Signed)
  CLINICAL SOCIAL WORK MATERNAL/CHILD NOTE  Patient Details  Name: Becky Stafford MRN: [id number] Date of Birth: 10/23/2014  Date:  10/24/2016  Clinical Social Worker Initiating Note:  Dervin Vore, LCSW Date/ Time Initiated:  10/24/16/1610     Child's Name:  Becky Stafford   Legal Guardian:  Other (Comment) (Parents: Becky and Becky Stafford)   Need for Interpreter:  None   Date of Referral:  10/24/16     Reason for Referral:  Other (Comment), Current Substance Use/Substance Use During Pregnancy  (Hx Anx/Dep and marijuana use)   Referral Source:  Central Nursery   Address:  [address] [place], [place] [address]  Phone number:  [phone]   Household Members:  Spouse   Natural Supports (not living in the home):  Friends, Immediate Family, Extended Family   Professional Supports: None   Employment:     Type of Work:     Education:      Financial Resources:  Private Insurance   Other Resources:      Cultural/Religious Considerations Which May Impact Care: None stated.  Strengths:  Ability to meet basic needs , Pediatrician chosen , Home prepared for child  (Pediatric follow up will be with Dr. Davis at [facility])   Risk Factors/Current Problems:  Mental Health Concerns , Substance Use    Cognitive State:  Alert , Able to Concentrate , Insightful , Linear Thinking , Goal Oriented    Mood/Affect:  Euthymic , Interested , Calm    CSW Assessment: CSW met with MOB in her first floor room/118 to offer support and complete assessment due to hx of anxiety and depression and hx of marijuana use.  MOB was pleasant and welcoming.  CSW found MOB to be in good spirits and easy to engage. CSW and MOB spoke at length about common emotions after delivery and throughout the postpartum period.  CSW provided education regarding signs and symptoms of PMADs.  MOB states she has struggled with anxiety and depression in the past and is currently taking Prozac.   She states she had a counselor a few years ago who she could call if she felt she needed counseling again.  She reports having a great support system and feeling happy about the baby. MOB was open about her marijuana use and states she has been smoking since age 16.  She states use "once in a while" and thinks that her last use was last week.  She reports smoking during pregnancy to help with nausea.  MOB was understanding of hospital drug screen policy and mandated reporting to Child Protective Services.  CSW informed MOB that baby's UDS is positive for marijuana.  MOB was calm and understanding with no concerns at this time. CPS report made to [place].  CSW informed MOB that this will not delay discharge and that CPS typically has 72 hours to respond to a report of marijuana use in pregnancy.  CSW explained that there will be a referral to [facility] for community support.  CSW notes no other concerns or need for intervention. MOB remained pleasant and thanked CSW for the information and for being open with her about the situation.  CSW Plan/Description:  Child Protective Service Report , No Further Intervention Required/No Barriers to Discharge, Patient/Family Education     Istvan Behar Elizabeth, LCSW 10/24/2016, 5:16 PM  

## 2016-10-24 NOTE — Progress Notes (Signed)
CSW notes baby's UDS is positive for marijuana.  CSW attempted to meet with MOB to complete assessment for marijuana use and hx of Anxiety, but she has visitors at this time.  CSW will return at a later time. 

## 2016-10-24 NOTE — Lactation Note (Signed)
This note was copied from a baby's chart. Lactation Consultation Note: Initial visit with mom. She states she tried BF but didn't really like and just wants to bottle feed formula now. Offered assist with latch but mom declines. States she thought she might give baby some Colostrum but has decided to just give formula. To call if changes her mind.   Patient Name: Girl Raj Janusmber Riggle Today's Date: 10/24/2016 Reason for consult: Initial assessment   Maternal Data Formula Feeding for Exclusion: Yes Reason for exclusion: Mother's choice to formula and breast feed on admission  Feeding Feeding Type: Bottle Fed - Formula  LATCH Score/Interventions                      Lactation Tools Discussed/Used     Consult Status Consult Status: Complete    Pamelia HoitWeeks, Kaeley Vinje D 10/24/2016, 11:36 AM

## 2016-10-24 NOTE — Progress Notes (Signed)
Patient ID: Raj JanusAmber Stafford, female   DOB: Jan 20, 1991, 25 y.o.   MRN: [id number] PPD # 1 SVD  S:  Reports feeling more soreness in bottom.             Tolerating po/ No nausea or vomiting             Bleeding is light             Pain controlled with ibuprofen (OTC)             Up ad lib / ambulatory / voiding without difficulties     Information for the patient's newborn:  [name], [name] [[id number]][[id number]]  female    bottle feeding   O:  A & O x 3, in no apparent distress              VS:  Vitals:   10/24/16 0116 10/24/16 0131 10/24/16 0235 10/24/16 0735  BP: 136/89 (!) 139/106 131/82 135/79  Pulse: 78 (!) 107 70 75  Resp:   18 16  Temp:   99.1 F (37.3 C) 98.2 F (36.8 C)  TempSrc:   Oral Oral  SpO2:    99%  Weight:      Height:        LABS:  Recent Labs  10/24/16 0001 10/24/16 0522  WBC 20.2* 23.5*  HGB 12.0 11.4*  HCT 34.1* 31.4*  PLT 276 287    Blood type: --/--/O POS, O POS (12/14 0830)  Rubella: Immune (05/19 0000)   I&O: I/O last 3 completed shifts: In: -  Out: 1936 [Urine:1050; Blood:886]          No intake/output data recorded.    Abdomen: soft, non-tender, non-distended             Fundus: firm, non-tender, U-1  Perineum: 2nd degree repair healing, no edema  Lochia: minimal  Extremities: no edema, no calf pain or tenderness    A/P: PPD # 1 25 y.o., G1P1001   Principal Problem:   Postpartum care following vaginal delivery (12/14)    Doing well - stable status  Routine post partum orders  Encouraged since both mother and father are smokers, to place a clean blanket over clothing before holding baby to prevent baby from breathing in the harmful toxins from cigarette smoke  Anticipate discharge tomorrow    Raelyn MoraAWSON, Nayson Traweek, M, MSN, CNM 10/24/2016, 10:43 AM

## 2016-10-25 ENCOUNTER — Inpatient Hospital Stay (HOSPITAL_COMMUNITY): Payer: BLUE CROSS/BLUE SHIELD

## 2016-10-25 MED ORDER — COCONUT OIL OIL: 1.0000 "application " | TOPICAL_OIL | 0 refills | Status: DC | PRN

## 2016-10-25 MED ORDER — IBUPROFEN 600 MG PO TABS: 600.0000 mg | ORAL_TABLET | Freq: Four times a day (QID) | ORAL | 0 refills | Status: DC

## 2016-10-25 NOTE — Progress Notes (Signed)
Post Partum Day #2           Information for the patient's newborn:  [name], Girl Dhara [[id number]][[id number]]  female  Feeding: bottle  Subjective: No HA, SOB, CP, F/C, breast symptoms. Pain minimal. Normal vaginal bleeding, no clots.      Objective:  VS:  Vitals:   10/24/16 0735 10/24/16 1535 10/24/16 1725 10/25/16 0557  BP: 135/79 129/74 135/76 128/62  Pulse: 75 96 92 75  Resp: 16 20 18 18   Temp: 98.2 F (36.8 C) 98.1 F (36.7 C) 97.7 F (36.5 C) 98 F (36.7 C)  TempSrc: Oral Oral Oral Oral  SpO2: 99% 100%    Weight:      Height:        No intake or output data in the 24 hours ending 10/25/16 0721     Recent Labs  10/24/16 0001 10/24/16 0522  WBC 20.2* 23.5*  HGB 12.0 11.4*  HCT 34.1* 31.4*  PLT 276 287    Blood type: --/--/O POS, O POS (12/14 0830) Rubella: Immune (05/19 0000)    Physical Exam:  General: alert, cooperative and no distress Uterine Fundus: firm Lochia: appropriate Perineum: repair intact, edema none DVT Evaluation: No cords or calf tenderness. No significant calf/ankle edema.    Assessment/Plan: PPD # 2 / 25 y.o., G1P1001 S/P:induced vaginal   Principal Problem:   Postpartum care following vaginal delivery (12/14)    normal postpartum exam  Continue current postpartum care  D/C home   LOS: 2 days   Neta Mendsaniela C Isiaih Hollenbach, CNM, MSN 10/25/2016, 7:21 AM

## 2016-10-25 NOTE — Discharge Summary (Signed)
Obstetric Discharge Summary Reason for Admission: induction of labor and post dates Prenatal Procedures: ultrasound Intrapartum Procedures: spontaneous vaginal delivery, GBS prophylaxis and epidural Postpartum Procedures: none Complications-Operative and Postpartum: 2nd degree perineal laceration Hemoglobin  Date Value Ref Range Status  10/24/2016 11.4 (L) 12.0 - 15.0 g/dL Final   HCT  Date Value Ref Range Status  10/24/2016 31.4 (L) 36.0 - 46.0 % Final    Physical Exam:  General: alert, cooperative and no distress Lochia: appropriate Uterine Fundus: firm Incision: healing well DVT Evaluation: No cords or calf tenderness. No significant calf/ankle edema.  Discharge Diagnoses: Term Pregnancy-delivered  Discharge Information: Date: 10/25/2016 Activity: pelvic rest Diet: routine Medications: PNV and Ibuprofen Condition: stable Instructions: refer to practice specific booklet Discharge to: home   Newborn Data: Live born female  Birth Weight: 7 lb 5.5 oz (3330 g) APGAR: 8, 8  Home with mother.  Neta MendsDaniela C Tonee Silverstein 10/25/2016, 10:57 AM

## 2016-12-03 ENCOUNTER — Ambulatory Visit (INDEPENDENT_AMBULATORY_CARE_PROVIDER_SITE_OTHER): Payer: BLUE CROSS/BLUE SHIELD | Admitting: Adult Health

## 2016-12-03 ENCOUNTER — Encounter: Payer: Self-pay | Admitting: Adult Health

## 2016-12-03 VITALS — BP 119/79 | HR 103 | Ht 65.25 in | Wt 141.0 lb

## 2016-12-03 DIAGNOSIS — N390 Urinary tract infection, site not specified: Secondary | ICD-10-CM

## 2016-12-03 DIAGNOSIS — D509 Iron deficiency anemia, unspecified: Secondary | ICD-10-CM

## 2016-12-03 DIAGNOSIS — D649 Anemia, unspecified: Secondary | ICD-10-CM | POA: Insufficient documentation

## 2016-12-03 DIAGNOSIS — G43109 Migraine with aura, not intractable, without status migrainosus: Secondary | ICD-10-CM | POA: Diagnosis not present

## 2016-12-03 DIAGNOSIS — F4323 Adjustment disorder with mixed anxiety and depressed mood: Secondary | ICD-10-CM

## 2016-12-03 DIAGNOSIS — K029 Dental caries, unspecified: Secondary | ICD-10-CM

## 2016-12-03 DIAGNOSIS — F17200 Nicotine dependence, unspecified, uncomplicated: Secondary | ICD-10-CM | POA: Diagnosis not present

## 2016-12-03 DIAGNOSIS — G43909 Migraine, unspecified, not intractable, without status migrainosus: Secondary | ICD-10-CM | POA: Insufficient documentation

## 2016-12-03 MED ORDER — BUTALBITAL-APAP-CAFFEINE 50-325-40 MG PO TABS: 1.0000 | ORAL_TABLET | ORAL | 0 refills | Status: AC | PRN

## 2016-12-03 NOTE — Assessment & Plan Note (Signed)
Brush teeth BID, floss daily.  Avoid sugary or acidic foods. Keep Feb 2018 dental appt.
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
## 2016-12-03 NOTE — Assessment & Plan Note (Signed)
Increase water intake.  Do not wear tight/restrictive clothing.

## 2016-12-03 NOTE — Patient Instructions (Addendum)
Migraine Headache A migraine headache is an intense, throbbing pain on one side or both sides of the head. Migraines may also cause other symptoms, such as nausea, vomiting, and sensitivity to light and noise. What are the causes? Doing or taking certain things may also trigger migraines, such as:  Alcohol.  Smoking.  Medicines, such as:  Medicine used to treat chest pain (nitroglycerine).  Birth control pills.  Estrogen pills.  Certain blood pressure medicines.  Aged cheeses, chocolate, or caffeine.  Foods or drinks that contain nitrates, glutamate, aspartame, or tyramine.  Physical activity. Other things that may trigger a migraine include:  Menstruation.  Pregnancy.  Hunger.  Stress, lack of sleep, too much sleep, or fatigue.  Weather changes. What increases the risk? The following factors may make you more likely to experience migraine headaches:  Age. Risk increases with age.  Family history of migraine headaches.  Being Caucasian.  Depression and anxiety.  Obesity.  Being a woman.  Having a hole in the heart (patent foramen ovale) or other heart problems. What are the signs or symptoms? The main symptom of this condition is pulsating or throbbing pain. Pain may:  Happen in any area of the head, such as on one side or both sides.  Interfere with daily activities.  Get worse with physical activity.  Get worse with exposure to bright lights or loud noises. Other symptoms may include:  Nausea.  Vomiting.  Dizziness.  General sensitivity to bright lights, loud noises, or smells. Before you get a migraine, you may get warning signs that a migraine is developing (aura). An aura may include:  Seeing flashing lights or having blind spots.  Seeing bright spots, halos, or zigzag lines.  Having tunnel vision or blurred vision.  Having numbness or a tingling feeling.  Having trouble talking.  Having muscle weakness. How is this diagnosed? A  migraine headache can be diagnosed based on:  Your symptoms.  A physical exam.  Tests, such as CT scan or MRI of the head. These imaging tests can help rule out other causes of headaches.  Taking fluid from the spine (lumbar puncture) and analyzing it (cerebrospinal fluid analysis, or CSF analysis). How is this treated? A migraine headache is usually treated with medicines that:  Relieve pain.  Relieve nausea.  Prevent migraines from coming back. Treatment may also include:  Acupuncture.  Lifestyle changes like avoiding foods that trigger migraines. Follow these instructions at home: Medicines  Take over-the-counter and prescription medicines only as told by your health care provider.  Do not drive or use heavy machinery while taking prescription pain medicine.  To prevent or treat constipation while you are taking prescription pain medicine, your health care provider may recommend that you:  Drink enough fluid to keep your urine clear or pale yellow.  Take over-the-counter or prescription medicines.  Eat foods that are high in fiber, such as fresh fruits and vegetables, whole grains, and beans.  Limit foods that are high in fat and processed sugars, such as fried and sweet foods. Lifestyle  Avoid alcohol use.  Do not use any products that contain nicotine or tobacco, such as cigarettes and e-cigarettes. If you need help quitting, ask your health care provider.  Get at least 8 hours of sleep every night.  Limit your stress. General instructions  Keep a journal to find out what may trigger your migraine headaches. For example, write down:  What you eat and drink.  How much sleep you get.  Any change  to your diet or medicines.  If you have a migraine:  Avoid things that make your symptoms worse, such as bright lights.  It may help to lie down in a dark, quiet room.  Do not drive or use heavy machinery.  Ask your health care provider what activities are  safe for you while you are experiencing symptoms.  Keep all follow-up visits as told by your health care provider. This is important. Contact a health care provider if:  You develop symptoms that are different or more severe than your usual migraine symptoms. Get help right away if:  Your migraine becomes severe.  You have a fever.  You have a stiff neck.  You have vision loss.  Your muscles feel weak or like you cannot control them.  You start to lose your balance often.  You develop trouble walking.  You faint. This information is not intended to replace advice given to you by your health care provider. Make sure you discuss any questions you have with your health care provider. Document Released: 10/27/2005 Document Revised: 05/16/2016 Document Reviewed: 04/14/2016 Elsevier Interactive Patient Education  2017 Elsevier Inc. Anemia, Nonspecific Anemia is a condition in which the concentration of red blood cells or hemoglobin in the blood is below normal. Hemoglobin is a substance in red blood cells that carries oxygen to the tissues of the body. Anemia results in not enough oxygen reaching these tissues. What are the causes? Common causes of anemia include:  Excessive bleeding. Bleeding may be internal or external. This includes excessive bleeding from periods (in women) or from the intestine.  Poor nutrition.  Chronic kidney, thyroid, and liver disease.  Bone marrow disorders that decrease red blood cell production.  Cancer and treatments for cancer.  HIV, AIDS, and their treatments.  Spleen problems that increase red blood cell destruction.  Blood disorders.  Excess destruction of red blood cells due to infection, medicines, and autoimmune disorders. What are the signs or symptoms?  Minor weakness.  Dizziness.  Headache.  Palpitations.  Shortness of breath, especially with exercise.  Paleness.  Cold  sensitivity.  Indigestion.  Nausea.  Difficulty sleeping.  Difficulty concentrating. Symptoms may occur suddenly or they may develop slowly. How is this diagnosed? Additional blood tests are often needed. These help your health care provider determine the best treatment. Your health care provider will check your stool for blood and look for other causes of blood loss. How is this treated? Treatment varies depending on the cause of the anemia. Treatment can include:  Supplements of iron, vitamin B12, or folic acid.  Hormone medicines.  A blood transfusion. This may be needed if blood loss is severe.  Hospitalization. This may be needed if there is significant continual blood loss.  Dietary changes.  Spleen removal. Follow these instructions at home: Keep all follow-up appointments. It often takes many weeks to correct anemia, and having your health care provider check on your condition and your response to treatment is very important. Get help right away if:  You develop extreme weakness, shortness of breath, or chest pain.  You become dizzy or have trouble concentrating.  You develop heavy vaginal bleeding.  You develop a rash.  You have bloody or black, tarry stools.  You faint.  You vomit up blood.  You vomit repeatedly.  You have abdominal pain.  You have a fever or persistent symptoms for more than 2-3 days.  You have a fever and your symptoms suddenly get worse.  You are dehydrated.  This information is not intended to replace advice given to you by your health care provider. Make sure you discuss any questions you have with your health care provider. Document Released: 12/04/2004 Document Revised: 04/09/2016 Document Reviewed: 04/22/2013 Elsevier Interactive Patient Education  2017 [employer]. Steps to Quit Smoking Smoking tobacco can be bad for your health. It can also affect almost every organ in your body. Smoking puts you and people around you at  risk for many serious long-lasting (chronic) diseases. Quitting smoking is hard, but it is one of the best things that you can do for your health. It is never too late to quit. What are the benefits of quitting smoking? When you quit smoking, you lower your risk for getting serious diseases and conditions. They can include:  Lung cancer or lung disease.  Heart disease.  Stroke.  Heart attack.  Not being able to have children (infertility).  Weak bones (osteoporosis) and broken bones (fractures). If you have coughing, wheezing, and shortness of breath, those symptoms may get better when you quit. You may also get sick less often. If you are pregnant, quitting smoking can help to lower your chances of having a baby of low birth weight. What can I do to help me quit smoking? Talk with your doctor about what can help you quit smoking. Some things you can do (strategies) include:  Quitting smoking totally, instead of slowly cutting back how much you smoke over a period of time.  Going to in-person counseling. You are more likely to quit if you go to many counseling sessions.  Using resources and support systems, such as:  Online chats with a Veterinary surgeon.  Phone quitlines.  Printed Materials engineer.  Support groups or group counseling.  Text messaging programs.  Mobile phone apps or applications.  Taking medicines. Some of these medicines may have nicotine in them. If you are pregnant or breastfeeding, do not take any medicines to quit smoking unless your doctor says it is okay. Talk with your doctor about counseling or other things that can help you. Talk with your doctor about using more than one strategy at the same time, such as taking medicines while you are also going to in-person counseling. This can help make quitting easier. What things can I do to make it easier to quit? Quitting smoking might feel very hard at first, but there is a lot that you can do to make it easier.  Take these steps:  Talk to your family and friends. Ask them to support and encourage you.  Call phone quitlines, reach out to support groups, or work with a Veterinary surgeon.  Ask people who smoke to not smoke around you.  Avoid places that make you want (trigger) to smoke, such as:  Bars.  Parties.  Smoke-break areas at work.  Spend time with people who do not smoke.  Lower the stress in your life. Stress can make you want to smoke. Try these things to help your stress:  Getting regular exercise.  Deep-breathing exercises.  Yoga.  Meditating.  Doing a body scan. To do this, close your eyes, focus on one area of your body at a time from head to toe, and notice which parts of your body are tense. Try to relax the muscles in those areas.  Download or buy apps on your mobile phone or tablet that can help you stick to your quit plan. There are many free apps, such as QuitGuide from the [employer] developer for Disease Control and  Prevention). You can find more support from smokefree.gov and other websites. This information is not intended to replace advice given to you by your health care provider. Make sure you discuss any questions you have with your health care provider. Document Released: 08/23/2009 Document Revised: 06/24/2016 Document Reviewed: 03/13/2015 Elsevier Interactive Patient Education  2017 [employer].  Continue medications as directed. If left upper tooth becomes infected (extreme warmth, purulent drainage, fever, night sweats) please call clinic immediately. Please return in 6 months for follow-up, or sooner if needed. Fasting labs will be obtained at next encounter.

## 2016-12-03 NOTE — Assessment & Plan Note (Signed)
Take medications as directed.  Avoid HA triggers.

## 2016-12-03 NOTE — Assessment & Plan Note (Signed)
Try to reduce tobacco use.  Will discuss at every encounter until she quits.

## 2016-12-03 NOTE — Assessment & Plan Note (Signed)
Increase iron rich foods

## 2016-12-12 DIAGNOSIS — R87612 Low grade squamous intraepithelial lesion on cytologic smear of cervix (LGSIL): Secondary | ICD-10-CM | POA: Diagnosis not present

## 2016-12-12 DIAGNOSIS — R875 Abnormal microbiological findings in specimens from female genital organs: Secondary | ICD-10-CM | POA: Diagnosis not present

## 2016-12-12 LAB — HM PAP SMEAR

## 2016-12-24 ENCOUNTER — Encounter: Payer: Self-pay | Admitting: Adult Health

## 2016-12-24 ENCOUNTER — Ambulatory Visit (INDEPENDENT_AMBULATORY_CARE_PROVIDER_SITE_OTHER): Payer: BLUE CROSS/BLUE SHIELD | Admitting: Adult Health

## 2016-12-24 DIAGNOSIS — R1031 Right lower quadrant pain: Secondary | ICD-10-CM | POA: Diagnosis not present

## 2016-12-24 DIAGNOSIS — K5900 Constipation, unspecified: Secondary | ICD-10-CM | POA: Diagnosis not present

## 2016-12-24 MED ORDER — POLYETHYLENE GLYCOL 3350 17 GM/SCOOP PO POWD: 17.0000 g | Freq: Two times a day (BID) | ORAL | 1 refills | Status: AC | PRN

## 2016-12-24 NOTE — Progress Notes (Signed)
Subjective:    Patient ID: Raj Janus, female    DOB: 1991/08/09, 26 y.o.   MRN: [id number]  HPI :  Ms. Scahill is here for RLQ pain that began about 2 weeks ago.  She gave birth to a happy,healthy baby girl two months ago.  Last menstrual period was 11/24/16-12/11/16.  She had post partum pap smear last week and reports that her GYN did not visualize a foreign body (i.e tampon).  She reports constant, dull RLQ abdominal pain that is 6/10 and jumps to 10/10 during intercourse.  Last year she had a tampon lodged in cervix that had to be removed via forceps.  She is concerned that it has occurred again.  She also reports constipation the last two weeks and has been stressed "alot more than normal" the last few weeks.  Last BM was this morning, however "just a small amount of poo" and last normal BM was > 1 week ago.  She denies fever/night sweats/N/V/poor appetite. She denies vaginal d/c, odor, pain with urination.  She does report pain with intercourse.   Patient Care Team    Relationship Specialty Notifications Start End  Malon Kindle, NP PCP - General Family Medicine  12/04/16     Patient Active Problem List   Diagnosis Date Noted  . Constipation 12/24/2016  . RLQ abdominal pain 12/24/2016  . Tobacco use disorder 12/03/2016  . Migraines 12/03/2016  . Anemia 12/03/2016  . Chronic UTI 12/03/2016  . Tooth decay 12/03/2016  . Postpartum care following vaginal delivery (12/14) 10/24/2016     Past Medical History:  Diagnosis Date  . Anxiety   . Chronic back pain    States she thinks it is scoliosis, not diagnosed  . Depression   . Headache   . History of PCOS   . Mood swings (HCC)   . 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 CYSTOSCOPY W/ URETERAL STENT PLACEMENT       Family History  Problem Relation Age of Onset  . Diabetes  Paternal Aunt   . Cancer Maternal Grandmother     pancreatic  . Heart attack Maternal Grandmother   . Alcohol abuse Father   . Heart attack Maternal Grandfather      History  Drug Use  . Types: Marijuana    Comment: Uses at night for nausea     History  Alcohol Use No     History  Smoking Status  . Current Every Day Smoker  . Packs/day: 1.00  . Years: 9.00  Smokeless Tobacco  . Never Used     Outpatient Encounter Prescriptions as of 12/24/2016  Medication Sig  . aspirin-acetaminophen-caffeine (EXCEDRIN MIGRAINE) 250-250-65 MG tablet Take 1 tablet by mouth every 6 (six) hours as needed for headache.  . butalbital-acetaminophen-caffeine (FIORICET, ESGIC) 50-325-40 MG tablet Take 1-2 tablets by mouth every 4 (four) hours as needed. Max 6 tabs per day  . Cholecalciferol (VITAMIN D-3 PO) Take by mouth.  . ferrous sulfate 325 (65 FE) MG tablet Take 325 mg by mouth daily with breakfast.  . FLUoxetine (PROZAC) 10 MG capsule Take 10 mg by mouth daily. Take with 20mg  cap for total dose of 30mg  QD  . FLUoxetine (PROZAC) 20 MG capsule Take 10 mg by mouth daily. Take with 10mg  cap for total dose of 30mg  QD  . Prenatal  Vit-Fe Fumarate-FA (PRENATAL MULTIVITAMIN) TABS tablet Take 1 tablet by mouth daily at 12 noon.  . [DISCONTINUED] coconut oil OIL Apply 1 application topically as needed.  . polyethylene glycol powder (GLYCOLAX/MIRALAX) powder Take 17 g by mouth 2 (two) times daily as needed.   No facility-administered encounter medications on file as of 12/24/2016.     Allergies: Peanut-containing drug products; Diclegis [doxylamine-pyridoxine]; Latex; and Sulfur  Body mass index is 24.7 kg/m.  Blood pressure 114/77, pulse (!) 116, temperature 98.5 F (36.9 C), temperature source Oral, height 5' 5.25" (1.657 m), weight 149 lb 9.6 oz (67.9 kg), last menstrual period 11/27/2016, not currently breastfeeding.    Review of Systems  Constitutional: Negative for activity change,  appetite change, chills, diaphoresis, fatigue, fever and unexpected weight change.  Respiratory: Negative for cough and shortness of breath.   Cardiovascular: Negative for chest pain, palpitations and leg swelling.  Gastrointestinal: Positive for abdominal pain and constipation. Negative for abdominal distention, anal bleeding, blood in stool, diarrhea, nausea, rectal pain and vomiting.  Genitourinary: Positive for dyspareunia. Negative for difficulty urinating, dysuria, flank pain, menstrual problem, pelvic pain, vaginal bleeding, vaginal discharge and vaginal pain.  Hematological: Negative for adenopathy. Does not bruise/bleed easily.       Objective:   Physical Exam  Constitutional: She is oriented to person, place, and time. She appears well-developed and well-nourished. No distress.  Cardiovascular: Normal rate, regular rhythm, normal heart sounds and intact distal pulses.   No murmur heard. Pulmonary/Chest: Effort normal and breath sounds normal. No respiratory distress. She exhibits no tenderness.  Abdominal: Soft. Bowel sounds are normal. She exhibits no distension and no mass. There is no hepatosplenomegaly. There is tenderness in the right lower quadrant. There is no rigidity, no rebound, no guarding, no CVA tenderness, no tenderness at McBurney's point and negative Murphy's sign. No hernia.  Genitourinary: Vagina normal and uterus normal. No erythema, tenderness or bleeding in the vagina. No foreign body in the vagina. No vaginal discharge found.  Genitourinary Comments: Pelvic performed with chaperone in room-Tonya [place], CMA  Neurological: She is alert and oriented to person, place, and time. She has normal reflexes.  Skin: Skin is warm and dry. No rash noted. She is not diaphoretic. No erythema. No pallor.  Psychiatric: She has a normal mood and affect. Her behavior is normal. Judgment and thought content normal.  Nursing note and vitals reviewed.         Assessment & Plan:    1. Constipation, unspecified constipation type   2. RLQ abdominal pain     Constipation Diet per handout. Miralax as directed. If constipation does not improve please call clinic.   RLQ abdominal pain Pelvic examination completed with chaperone in room-Tonya [place], CMA No foreign body, odor, discharge noted.     FOLLOW-UP:  Return if symptoms worsen or fail to improve.

## 2016-12-24 NOTE — Assessment & Plan Note (Signed)
Diet per handout. Miralax as directed. If constipation does not improve please call clinic.

## 2016-12-24 NOTE — Assessment & Plan Note (Signed)
Pelvic examination completed with chaperone in room-Tonya [place], CMA No foreign body, odor, discharge noted.

## 2016-12-24 NOTE — Patient Instructions (Addendum)
Constipation, Adult Constipation is when a person:  Poops (has a bowel movement) fewer times in a week than normal.  Has a hard time pooping.  Has poop that is dry, hard, or bigger than normal. Follow these instructions at home: Eating and drinking  Eat foods that have a lot of fiber, such as:  Fresh fruits and vegetables.  Whole grains.  Beans.  Eat less of foods that are high in fat, low in fiber, or overly processed, such as:  [place] fries.  Hamburgers.  Cookies.  Candy.  Soda.  Drink enough fluid to keep your pee (urine) clear or pale yellow. General instructions  Exercise regularly or as told by your doctor.  Go to the restroom when you feel like you need to poop. Do not hold it in.  Take over-the-counter and prescription medicines only as told by your doctor. These include any fiber supplements.  Do pelvic floor retraining exercises, such as:  Doing deep breathing while relaxing your lower belly (abdomen).  Relaxing your pelvic floor while pooping.  Watch your condition for any changes.  Keep all follow-up visits as told by your doctor. This is important. Contact a doctor if:  You have pain that gets worse.  You have a fever.  You have not pooped for 4 days.  You throw up (vomit).  You are not hungry.  You lose weight.  You are bleeding from the anus.  You have thin, pencil-like poop (stool). Get help right away if:  You have a fever, and your symptoms suddenly get worse.  You leak poop or have blood in your poop.  Your belly feels hard or bigger than normal (is bloated).  You have very bad belly pain.  You feel dizzy or you faint. This information is not intended to replace advice given to you by your health care provider. Make sure you discuss any questions you have with your health care provider. Document Released: 04/14/2008 Document Revised: 05/16/2016 Document Reviewed: 04/16/2016 Elsevier Interactive Patient Education  2017  [employer]  Diet as above. Miralax as directed. If constipation does not improve please call clinic.

## 2017-01-22 DIAGNOSIS — Z3200 Encounter for pregnancy test, result unknown: Secondary | ICD-10-CM | POA: Diagnosis not present

## 2017-01-22 DIAGNOSIS — N870 Mild cervical dysplasia: Secondary | ICD-10-CM | POA: Diagnosis not present

## 2017-01-22 DIAGNOSIS — R87612 Low grade squamous intraepithelial lesion on cytologic smear of cervix (LGSIL): Secondary | ICD-10-CM | POA: Diagnosis not present

## 2017-02-16 ENCOUNTER — Telehealth: Payer: Self-pay | Admitting: Adult Health

## 2017-02-16 NOTE — Telephone Encounter (Signed)
Please have her make an OV Thanks Katy  

## 2017-02-16 NOTE — Telephone Encounter (Signed)
Pt informed.  Pt expressed understanding and is agreeable.  Pt was transferred to front desk to schedule OV.  Tiajuana Amass, CMA

## 2017-02-16 NOTE — Telephone Encounter (Signed)
Patient called and stated when she was last in she was having tooth pain. She said Orpha Bur advised her that she could prescribe her an antibiotic to help with the abscess/infection. Patient states she is going to a dentist Friday to have tooth removed and that it would be good for her to get started on a round of antibiotics.

## 2017-02-16 NOTE — Telephone Encounter (Signed)
Please advise.  T. Alazar Cherian, CMA 

## 2017-02-19 ENCOUNTER — Ambulatory Visit (INDEPENDENT_AMBULATORY_CARE_PROVIDER_SITE_OTHER): Payer: BLUE CROSS/BLUE SHIELD | Admitting: Adult Health

## 2017-02-19 ENCOUNTER — Encounter: Payer: Self-pay | Admitting: Adult Health

## 2017-02-19 VITALS — BP 135/89 | HR 94 | Ht 65.25 in | Wt 153.4 lb

## 2017-02-19 DIAGNOSIS — K047 Periapical abscess without sinus: Secondary | ICD-10-CM | POA: Diagnosis not present

## 2017-02-19 DIAGNOSIS — N912 Amenorrhea, unspecified: Secondary | ICD-10-CM

## 2017-02-19 LAB — POCT URINE PREGNANCY: PREG TEST UR: NEGATIVE

## 2017-02-19 MED ORDER — CLINDAMYCIN HCL 300 MG PO CAPS: 300.0000 mg | ORAL_CAPSULE | Freq: Three times a day (TID) | ORAL | 0 refills | Status: AC

## 2017-02-19 NOTE — Assessment & Plan Note (Signed)
Please take antibiotic as directed. Use warm salt water gargles several times daily. Alternate OTC Acetaminophen and Ibuprofen as directed by manufacturer's directions. Please follow-up with dentist. Please call clinic with any questions/concerns.

## 2017-02-19 NOTE — Patient Instructions (Signed)
Dental Abscess A dental abscess is pus in or around a tooth. Follow these instructions at home:  Take medicines only as told by your dentist.  If you were prescribed antibiotic medicine, finish all of it even if you start to feel better.  Rinse your mouth (gargle) often with salt water.  Do not drive or use heavy machinery, like a lawn mower, while taking pain medicine.  Do not apply heat to the outside of your mouth.  Keep all follow-up visits as told by your dentist. This is important. Contact a doctor if:  Your pain is worse, and medicine does not help. Get help right away if:  You have a fever or chills.  Your symptoms suddenly get worse.  You have a very bad headache.  You have problems breathing or swallowing.  You have trouble opening your mouth.  You have puffiness (swelling) in your neck or around your eye. This information is not intended to replace advice given to you by your health care provider. Make sure you discuss any questions you have with your health care provider. Document Released: 03/13/2015 Document Revised: 04/03/2016 Document Reviewed: 10/24/2014 Elsevier Interactive Patient Education  2017 Else[employer]. Clindamycin capsules What is this medicine? CLINDAMYCIN (KLIN da MYE sin) is a lincosamide antibiotic. It is used to treat certain kinds of bacterial infections. It will not work for colds, flu, or other viral infections. This medicine may be used for other purposes; ask your health care provider or pharmacist if you have questions. COMMON BRAND NAME(S): Cleocin What should I tell my health care provider before I take this medicine? They need to know if you have any of these conditions: -kidney disease -liver disease -stomach problems like colitis -an unusual or allergic reaction to clindamycin, lincomycin, or other medicines, foods, dyes like tartrazine or preservatives -pregnant or trying to get pregnant -breast-feeding How should I use this  medicine? Take this medicine by mouth with a full glass of water. Follow the directions on the prescription label. You can take this medicine with food or on an empty stomach. If the medicine upsets your stomach, take it with food. Take your medicine at regular intervals. Do not take your medicine more often than directed. Take all of your medicine as directed even if you think your are better. Do not skip doses or stop your medicine early. Talk to your pediatrician regarding the use of this medicine in children. Special care may be needed. Overdosage: If you think you have taken too much of this medicine contact a poison control center or emergency room at once. NOTE: This medicine is only for you. Do not share this medicine with others. What if I miss a dose? If you miss a dose, take it as soon as you can. If it is almost time for your next dose, take only that dose. Do not take double or extra doses. What may interact with this medicine? -birth control pills -erythromycin -medicines that relax muscles for surgery -rifampin This list may not describe all possible interactions. Give your health care provider a list of all the medicines, herbs, non-prescription drugs, or dietary supplements you use. Also tell them if you smoke, drink alcohol, or use illegal drugs. Some items may interact with your medicine. What should I watch for while using this medicine? Tell your doctor or healthcare professional if your symptoms do not start to get better or if they get worse. Do not treat diarrhea with over the counter products. Contact your doctor if  you have diarrhea that lasts more than 2 days or if it is severe and watery. What side effects may I notice from receiving this medicine? Side effects that you should report to your doctor or health care professional as soon as possible: -allergic reactions like skin rash, itching or hives, swelling of the face, lips, or tongue -dark urine -pain on  swallowing -redness, blistering, peeling or loosening of the skin, including inside the mouth -unusual bleeding or bruising -unusually weak or tired -yellowing of eyes or skin Side effects that usually do not require medical attention (report to your doctor or health care professional if they continue or are bothersome): -diarrhea -itching in the rectal or genital area -joint pain -nausea, vomiting -stomach pain This list may not describe all possible side effects. Call your doctor for medical advice about side effects. You may report side effects to FDA at 1-800-FDA-1088. Where should I keep my medicine? Keep out of the reach of children. Store at room temperature between 20 and 25 degrees C (68 and 77 degrees F). Throw away any unused medicine after the expiration date. NOTE: This sheet is a summary. It may not cover all possible information. If you have questions about this medicine, talk to your doctor, pharmacist, or health care provider.  2018 Elsevier/Gold Standard (2016-01-30 16:34:00)   Please take antibiotic as directed. Use warm salt water gargles several times daily. Alternate OTC Acetaminophen and Ibuprofen as directed by manufacturer's directions. Please follow-up with dentist. Please call clinic with any questions/concerns.

## 2017-02-19 NOTE — Progress Notes (Signed)
Subjective:    Patient ID: Becky Stafford, female    DOB: 10/13/1991, 26 y.o.   MRN: [id number]  HPI:  Ms. Zobrist cracked a tooth during pregnancy (est summer 2017) and today presents with tooth abscess pain (constant dull ache 5/10) and swelling that has been progressively worsening > 6 weeks.  She reports difficultly masticating food and has "finally made an appt with the dentist" that is scheduled for next week. She denies fever/N/V.  Of Note: She cannot recall the date of LMP and feels "like my stomach is just getting bigger by the day". In clinic pregnancy test-negative. They are not preventing pregnancy and "would like another baby close in age" to their 5 month old baby.  Patient Care Team    Relationship Specialty Notifications Start End  Malon Kindle, NP PCP - General Family Medicine  12/04/16     Patient Active Problem List   Diagnosis Date Noted  . Dental abscess 02/19/2017  . Constipation 12/24/2016  . RLQ abdominal pain 12/24/2016  . Tobacco use disorder 12/03/2016  . Migraines 12/03/2016  . Anemia 12/03/2016  . Chronic UTI 12/03/2016  . Tooth decay 12/03/2016  . Postpartum care following vaginal delivery (12/14) 10/24/2016     Past Medical History:  Diagnosis Date  . Anxiety   . Chronic back pain    States she thinks it is scoliosis, not diagnosed  . Depression   . Headache   . History of PCOS   . Mood swings (HCC)   . Unilateral congenital vesico-uretero-renal reflux    L kidney, has received tx, "putty" to prevent reflux, sees MD at [facility]     Past Surgical History:  Procedure Laterality Date  . CYSTOSCOPY W/ URETERAL STENT PLACEMENT       Family History  Problem Relation Age of Onset  . Diabetes Paternal Aunt   . Cancer Maternal Grandmother     pancreatic  . Heart attack Maternal Grandmother   . Alcohol abuse Father   . Heart attack Maternal Grandfather      History  Drug Use  . Types: Marijuana    Comment: Uses at night for nausea      History  Alcohol Use No     History  Smoking Status  . Current Every Day Smoker  . Packs/day: 1.00  . Years: 9.00  Smokeless Tobacco  . Never Used     Outpatient Encounter Prescriptions as of 02/19/2017  Medication Sig  . butalbital-acetaminophen-caffeine (FIORICET, ESGIC) 50-325-40 MG tablet Take 1-2 tablets by mouth every 4 (four) hours as needed. Max 6 tabs per day  . Cholecalciferol (VITAMIN D-3 PO) Take by mouth.  . ferrous sulfate 325 (65 FE) MG tablet Take 325 mg by mouth daily with breakfast.  . FLUoxetine (PROZAC) 10 MG capsule Take 10 mg by mouth daily. Take with  cap for total dose of  QD  . FLUoxetine (PROZAC) 20 MG capsule Take 10 mg by mouth daily. Take with  cap for total dose of  QD  . polyethylene glycol powder (GLYCOLAX/MIRALAX) powder Take 17 g by mouth 2 (two) times daily as needed.  . Prenatal Vit-Fe Fumarate-FA (PRENATAL MULTIVITAMIN) TABS tablet Take 1 tablet by mouth daily at 12 noon.  . clindamycin (CLEOCIN) 300 MG capsule Take 1 capsule (300 mg total) by mouth 3 (three) times daily.  . [DISCONTINUED] aspirin-acetaminophen-caffeine (EXCEDRIN MIGRAINE) 250-250-65 MG tablet Take 1 tablet by mouth every 6 (six) hours as needed for headache.   No facility-administered  encounter medications on file as of 02/19/2017.     Allergies: Peanut-containing drug products; Diclegis [doxylamine-pyridoxine]; Latex; and Sulfur  Body mass index is 25.33 kg/m.  Blood pressure 135/89, pulse 94, height 5' 5.25" (1.657 m), weight 153 lb 6.4 oz (69.6 kg), last menstrual period 11/12/2016, not currently breastfeeding.     Review of Systems  Constitutional: Positive for appetite change. Negative for activity change, chills, diaphoresis, fatigue, fever and unexpected weight change.  HENT: Positive for dental problem.   Respiratory: Negative for cough, chest tightness, shortness of breath, wheezing and stridor.   Cardiovascular: Negative for chest pain  and leg swelling.  Skin: Negative for color change, pallor, rash and wound.  Allergic/Immunologic: Negative for immunocompromised state.  Hematological: Does not bruise/bleed easily.       Objective:   Physical Exam  Constitutional: She appears well-developed and well-nourished. No distress.  HENT:  Head: Normocephalic and atraumatic.  Mouth/Throat: Uvula is midline. Abnormal dentition. Dental abscesses and dental caries present.    Cracked top left tooth with edematous tissue around. Blackened enamel noted on outside of tooth. No blood noted.  Cardiovascular: Normal rate, regular rhythm and intact distal pulses.   No murmur heard. Pulmonary/Chest: Effort normal and breath sounds normal. No respiratory distress. She has no wheezes. She has no rales. She exhibits no tenderness.  Skin: Skin is warm and dry. No rash noted. She is not diaphoretic. There is erythema. No pallor.  Psychiatric: She has a normal mood and affect. Her behavior is normal. Judgment and thought content normal.  Nursing note and vitals reviewed.         Assessment & Plan:   1. Amenorrhea   2. Dental abscess     Dental abscess Please take antibiotic as directed. Use warm salt water gargles several times daily. Alternate OTC Acetaminophen and Ibuprofen as directed by manufacturer's directions. Please follow-up with dentist. Please call clinic with any questions/concerns.    FOLLOW-UP:  Return if symptoms worsen or fail to improve.

## 2017-05-12 ENCOUNTER — Ambulatory Visit: Payer: BLUE CROSS/BLUE SHIELD | Admitting: Adult Health

## 2017-05-26 ENCOUNTER — Ambulatory Visit: Payer: BLUE CROSS/BLUE SHIELD | Admitting: Adult Health

## 2017-06-02 ENCOUNTER — Ambulatory Visit: Payer: BLUE CROSS/BLUE SHIELD | Admitting: Adult Health

## 2017-06-09 ENCOUNTER — Ambulatory Visit: Payer: BLUE CROSS/BLUE SHIELD | Admitting: Adult Health

## 2017-07-13 DIAGNOSIS — Z9109 Other allergy status, other than to drugs and biological substances: Secondary | ICD-10-CM | POA: Insufficient documentation

## 2017-07-13 DIAGNOSIS — F329 Major depressive disorder, single episode, unspecified: Secondary | ICD-10-CM | POA: Insufficient documentation

## 2017-07-13 DIAGNOSIS — N925 Other specified irregular menstruation: Secondary | ICD-10-CM | POA: Diagnosis not present

## 2017-07-13 DIAGNOSIS — M79641 Pain in right hand: Secondary | ICD-10-CM | POA: Diagnosis not present

## 2017-07-13 DIAGNOSIS — F32A Depression, unspecified: Secondary | ICD-10-CM | POA: Insufficient documentation

## 2017-07-14 ENCOUNTER — Emergency Department (HOSPITAL_COMMUNITY)
Admission: EM | Admit: 2017-07-14 | Discharge: 2017-07-14 | Discharge status: Against Medical Advice | Payer: BLUE CROSS/BLUE SHIELD

## 2017-07-14 NOTE — ED Notes (Signed)
Called patient to be triage and no answer. 

## 2017-07-14 NOTE — ED Notes (Signed)
Patient called [employer] and said that she left ER that the wait is to long.

## 2017-07-15 DIAGNOSIS — R5383 Other fatigue: Secondary | ICD-10-CM | POA: Diagnosis not present

## 2017-07-15 DIAGNOSIS — Z130 Encounter for screening for diseases of the blood and blood-forming organs and certain disorders involving the immune mechanism: Secondary | ICD-10-CM | POA: Diagnosis not present

## 2017-07-15 DIAGNOSIS — N926 Irregular menstruation, unspecified: Secondary | ICD-10-CM | POA: Diagnosis not present

## 2017-07-22 DIAGNOSIS — M79641 Pain in right hand: Secondary | ICD-10-CM | POA: Diagnosis not present

## 2017-07-27 ENCOUNTER — Encounter (HOSPITAL_COMMUNITY): Payer: Self-pay | Admitting: *Deleted

## 2017-07-27 DIAGNOSIS — Z5321 Procedure and treatment not carried out due to patient leaving prior to being seen by health care provider: Secondary | ICD-10-CM | POA: Insufficient documentation

## 2017-07-27 DIAGNOSIS — M549 Dorsalgia, unspecified: Secondary | ICD-10-CM | POA: Insufficient documentation

## 2017-07-27 LAB — CBC
HCT: 42.9 % (ref 36.0–46.0)
HEMOGLOBIN: 15 g/dL (ref 12.0–15.0)
MCH: 30.2 pg (ref 26.0–34.0)
MCHC: 35 g/dL (ref 30.0–36.0)
MCV: 86.3 fL (ref 78.0–100.0)
PLATELETS: 286 10*3/uL (ref 150–400)
RBC: 4.97 MIL/uL (ref 3.87–5.11)
RDW: 13.3 % (ref 11.5–15.5)
WBC: 11.4 10*3/uL — AB (ref 4.0–10.5)

## 2017-07-27 NOTE — ED Triage Notes (Signed)
Pt c/o back pain for the past two days. Pt reports having puddy placed in kidneys for kidney reflux; pt has been sleeping on a hard bed and feels like the muscles in her back are from that. Pt feels dehydrated as well c/o headache

## 2017-07-28 ENCOUNTER — Emergency Department (HOSPITAL_COMMUNITY)
Admission: EM | Admit: 2017-07-28 | Discharge: 2017-07-28 | Disposition: A | Discharge status: Home / Self Care | Payer: BLUE CROSS/BLUE SHIELD | Attending: Emergency Medicine | Admitting: Emergency Medicine

## 2017-07-28 LAB — POC URINE PREG, ED: PREG TEST UR: NEGATIVE

## 2017-07-28 LAB — URINALYSIS, ROUTINE W REFLEX MICROSCOPIC
Bilirubin Urine: NEGATIVE
Glucose, UA: NEGATIVE mg/dL
Hgb urine dipstick: NEGATIVE
Ketones, ur: NEGATIVE mg/dL
LEUKOCYTES UA: NEGATIVE
NITRITE: NEGATIVE
Protein, ur: NEGATIVE mg/dL
SPECIFIC GRAVITY, URINE: 1.018 (ref 1.005–1.030)
pH: 7 (ref 5.0–8.0)

## 2017-07-28 LAB — BASIC METABOLIC PANEL
ANION GAP: 7 (ref 5–15)
BUN: 7 mg/dL (ref 6–20)
CALCIUM: 9.3 mg/dL (ref 8.9–10.3)
CO2: 21 mmol/L — AB (ref 22–32)
CREATININE: 0.99 mg/dL (ref 0.44–1.00)
Chloride: 105 mmol/L (ref 101–111)
GFR calc non Af Amer: >60 mL/min (ref >60)
Glucose, Bld: 135 mg/dL — ABNORMAL HIGH (ref 65–99)
Potassium: 3.8 mmol/L (ref 3.5–5.1)
SODIUM: 133 mmol/L — AB (ref 135–145)

## 2017-07-28 NOTE — ED Notes (Signed)
Pt was called for vitals, no response.  

## 2017-07-30 DIAGNOSIS — S62306A Unspecified fracture of fifth metacarpal bone, right hand, initial encounter for closed fracture: Secondary | ICD-10-CM | POA: Diagnosis not present

## 2017-07-30 DIAGNOSIS — X58XXXA Exposure to other specified factors, initial encounter: Secondary | ICD-10-CM | POA: Diagnosis not present

## 2017-07-30 DIAGNOSIS — Y999 Unspecified external cause status: Secondary | ICD-10-CM | POA: Diagnosis not present

## 2017-07-30 DIAGNOSIS — S62326A Displaced fracture of shaft of fifth metacarpal bone, right hand, initial encounter for closed fracture: Secondary | ICD-10-CM | POA: Diagnosis not present

## 2017-08-10 DIAGNOSIS — S62306D Unspecified fracture of fifth metacarpal bone, right hand, subsequent encounter for fracture with routine healing: Secondary | ICD-10-CM | POA: Diagnosis not present

## 2017-08-21 DIAGNOSIS — F331 Major depressive disorder, recurrent, moderate: Secondary | ICD-10-CM | POA: Diagnosis not present

## 2017-08-21 DIAGNOSIS — F900 Attention-deficit hyperactivity disorder, predominantly inattentive type: Secondary | ICD-10-CM | POA: Diagnosis not present

## 2017-08-24 DIAGNOSIS — S62306D Unspecified fracture of fifth metacarpal bone, right hand, subsequent encounter for fracture with routine healing: Secondary | ICD-10-CM | POA: Diagnosis not present

## 2017-09-07 DIAGNOSIS — S62306D Unspecified fracture of fifth metacarpal bone, right hand, subsequent encounter for fracture with routine healing: Secondary | ICD-10-CM | POA: Diagnosis not present

## 2017-12-24 ENCOUNTER — Ambulatory Visit: Payer: BLUE CROSS/BLUE SHIELD | Admitting: Adult Health

## 2017-12-28 ENCOUNTER — Ambulatory Visit: Payer: BLUE CROSS/BLUE SHIELD | Admitting: Adult Health

## 2017-12-28 NOTE — Progress Notes (Deleted)
   Subjective:    Patient ID: Becky Stafford, female    DOB: 11-07-1991, 27 y.o.   MRN: [id number]  HPIL  Becky Stafford presents with      Review of Systems     Objective:   Physical Exam        Assessment & Plan:

## 2018-01-05 DIAGNOSIS — F4321 Adjustment disorder with depressed mood: Secondary | ICD-10-CM | POA: Diagnosis not present

## 2018-01-15 DIAGNOSIS — N760 Acute vaginitis: Secondary | ICD-10-CM | POA: Diagnosis not present

## 2018-07-15 DIAGNOSIS — F3342 Major depressive disorder, recurrent, in full remission: Secondary | ICD-10-CM | POA: Diagnosis not present

## 2018-07-15 DIAGNOSIS — F900 Attention-deficit hyperactivity disorder, predominantly inattentive type: Secondary | ICD-10-CM | POA: Diagnosis not present

## 2018-09-22 IMAGING — MR MR HEAD W/O CM
9 of 10 series · 35 of 48 positions shown · non-contrast
Comparison: None.

CLINICAL DATA: Recurrent episodes of mouth swelling and vomiting
for 1 day. Slowed, slurred speech. Similar symptoms at age 18.

EXAM:
MRI HEAD WITHOUT CONTRAST
TECHNIQUE: Multiplanar, multiecho pulse sequences of the brain and surrounding
structures were obtained without intravenous contrast.

[Series 3: T1 · sagittal · 5.0mm · 0.47mm/px · 1 of 23 slices shown]
[im 1/23]
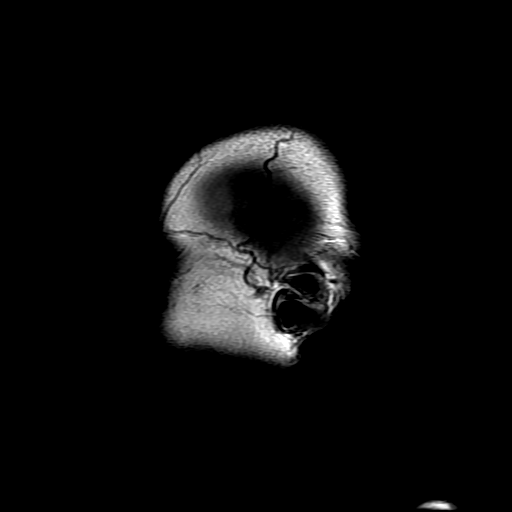

[Series 4: DWI · axial · 3.0mm · 1.09mm/px · z∈[-61,+75]mm · 8 of 94 slices shown (1 of 4)]
[im 1/94]
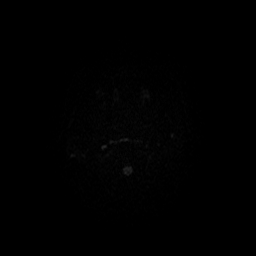
[im 11/94]
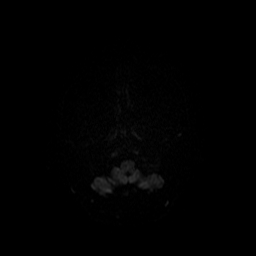
[im 32/94]
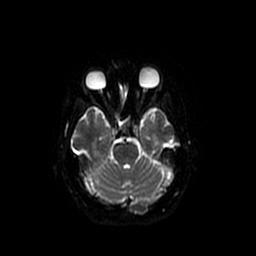
[im 42/94]
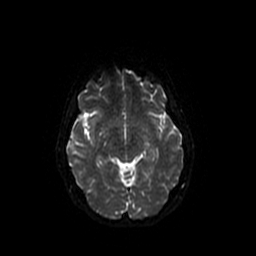
[im 52/94]
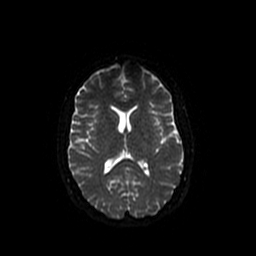
[im 63/94]
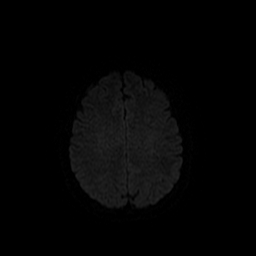
[im 83/94]
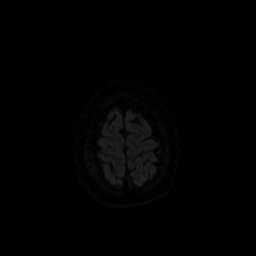
[im 94/94]
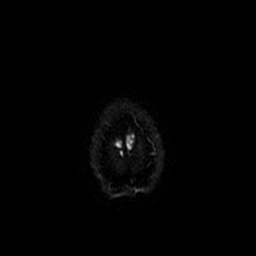

[Series 5: DWI · coronal · 5.0mm · 1.09mm/px · 7 of 66 slices shown (2 of 4)]
[im 1/66]
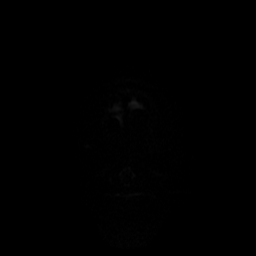
[im 11/66]
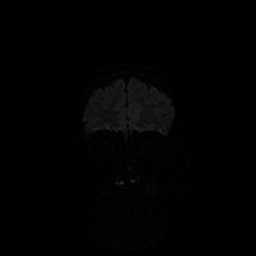
[im 22/66]
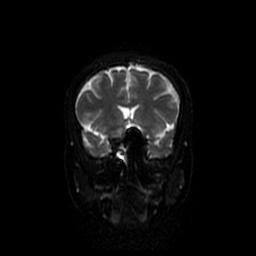
[im 33/66]
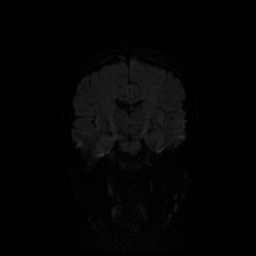
[im 44/66]
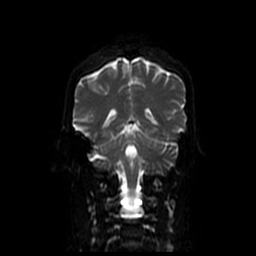
[im 55/66]
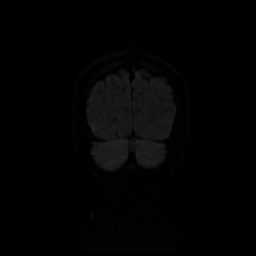
[im 66/66]
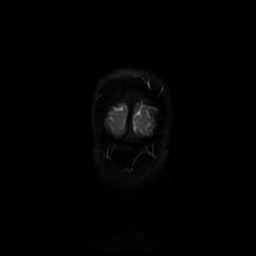

[Series 6: T2 · axial · 5.0mm · 0.43mm/px · z∈[-56,+80]mm · 3 of 24 slices shown (1 of 2)]
[im 1/24]
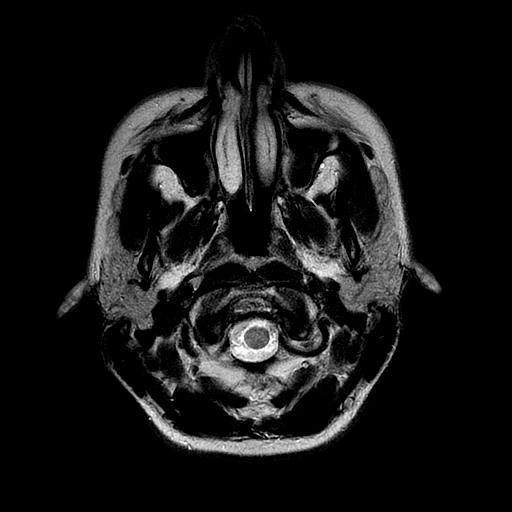
[im 12/24]
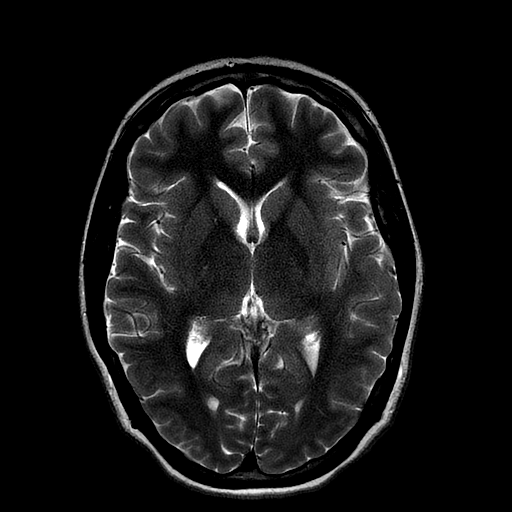
[im 24/24]
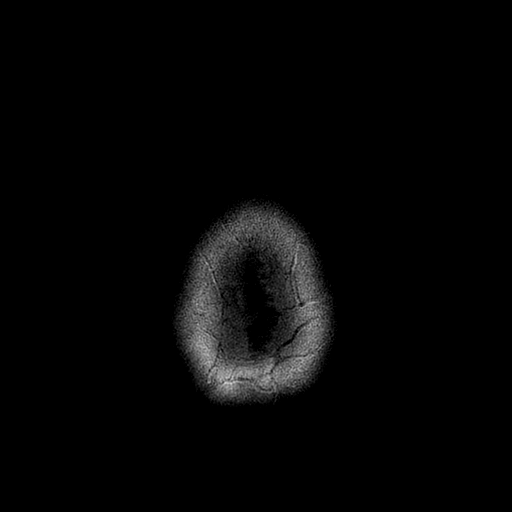

[Series 7: FLAIR · axial · 5.0mm · 0.43mm/px · z∈[-56,+80]mm · 3 of 24 slices shown]
[im 1/24]
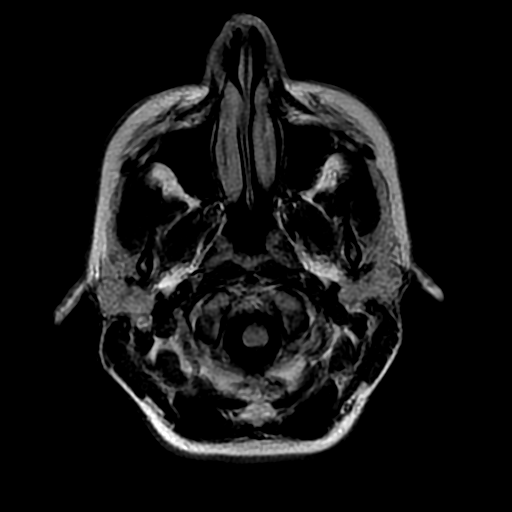
[im 12/24]
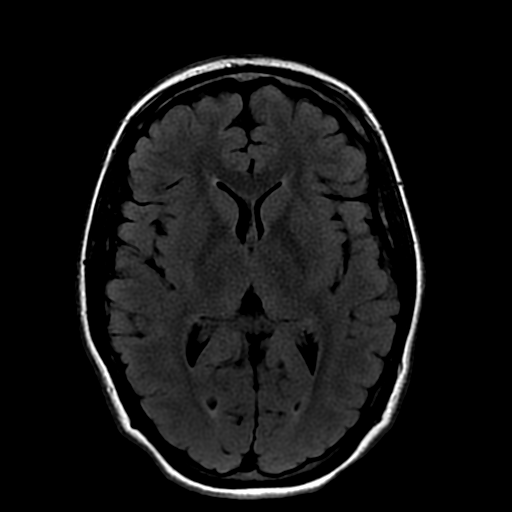
[im 24/24]
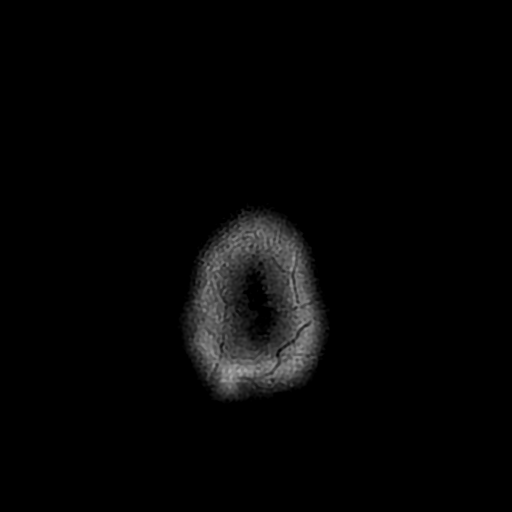

[Series 8: ax mpgr · axial · 5.0mm · 0.43mm/px · z∈[-56,+9]mm · 2 of 24 slices shown]
[im 1/24]
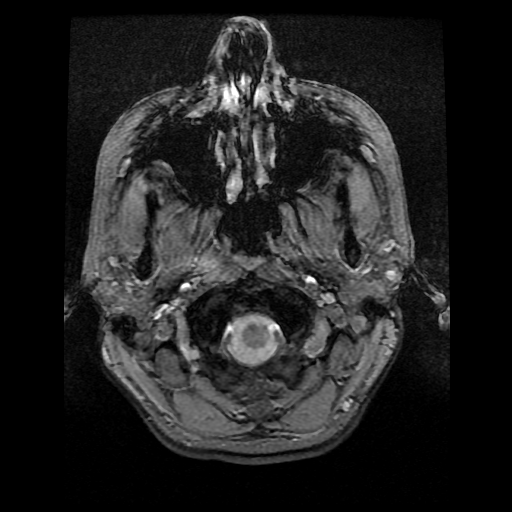
[im 12/24]
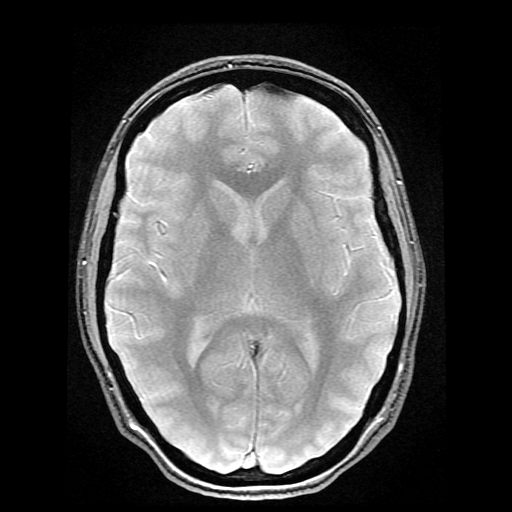

[Series 10: T2 · coronal · 5.0mm · 0.43mm/px · 3 of 28 slices shown (2 of 2)]
[im 1/28]
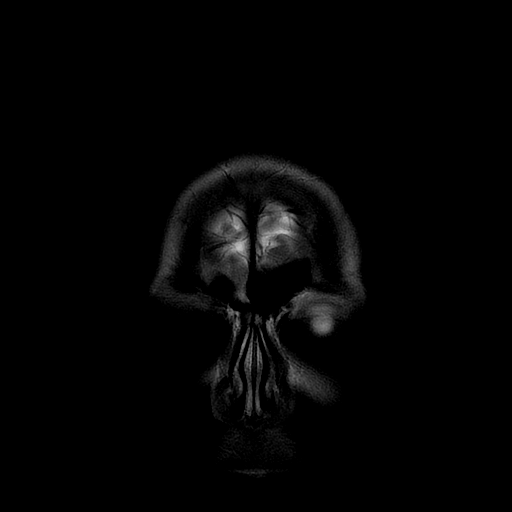
[im 14/28]
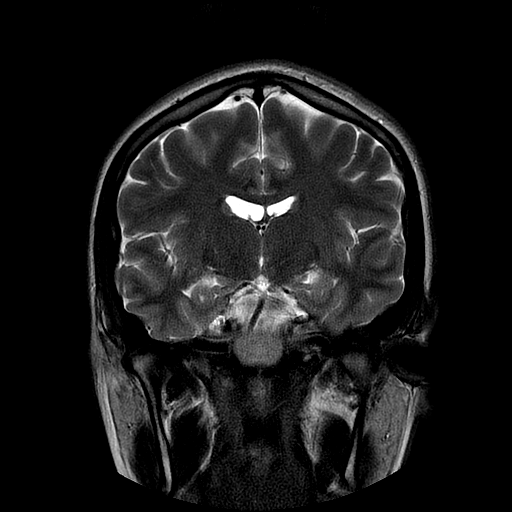
[im 28/28]
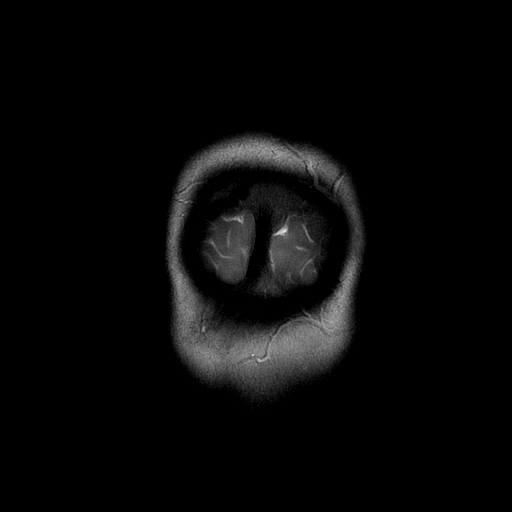

[Series 400: DWI · axial · 3.0mm · 1.09mm/px · z∈[-61,+75]mm · 5 of 47 slices shown (3 of 4)]
[im 1/47]
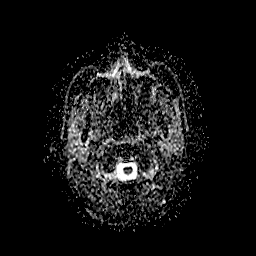
[im 12/47]
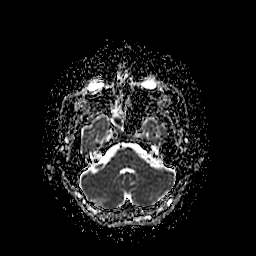
[im 24/47]
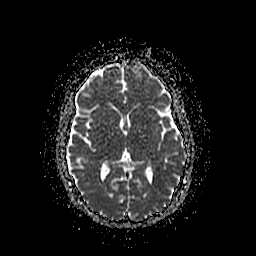
[im 35/47]
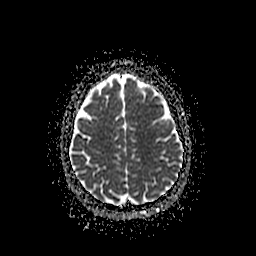
[im 47/47]
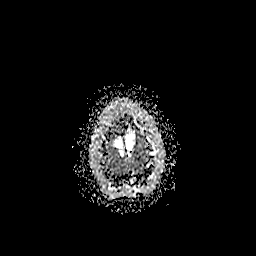

[Series 500: DWI · coronal · 5.0mm · 1.09mm/px · 3 of 33 slices shown (4 of 4)]
[im 1/33]
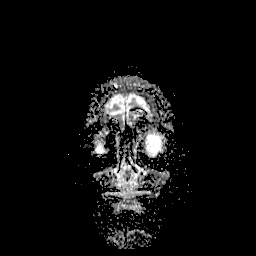
[im 17/33]
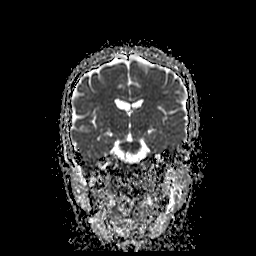
[im 33/33]
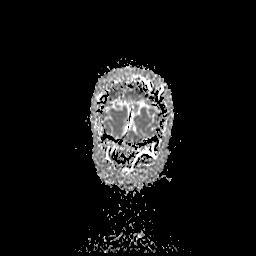

[35 of 48 positions shown; findings below may reference images not displayed]

FINDINGS: BRAIN: No reduced diffusion to suggest acute ischemia nor hyperacute
demyelination. No susceptibility artifact to suggest hemorrhage. The
ventricles and sulci are normal for patient's age. No suspicious
parenchymal signal, masses or mass effect. No abnormal extra-axial
fluid collections. No extra-axial masses though, contrast enhanced
sequences would be more sensitive.

VASCULAR: Normal major intracranial vascular flow voids present at
skull base. Major dural venous sinus flow voids maintained.

SKULL AND UPPER CERVICAL SPINE: No abnormal sellar expansion. No
suspicious calvarial bone marrow signal. Craniocervical junction
maintained.

SINUSES/ORBITS: Soft tissue opacifies the RIGHT sphenoid sinus.
Trace mastoid effusions. The included ocular globes and orbital
contents are non-suspicious.

OTHER: None.
IMPRESSION: Normal noncontrast MRI head.

RIGHT sphenoid sinusitis.
